# Patient Record
Sex: Male | Born: 1950 | Race: White | Hispanic: No | Smoking: Never smoker
Health system: Southern US, Community
[De-identification: ages and names within clinical notes are randomized; demographics above are authoritative.]

## PROBLEM LIST (undated history)

## (undated) DIAGNOSIS — E785 Hyperlipidemia, unspecified: Secondary | ICD-10-CM

## (undated) DIAGNOSIS — I10 Essential (primary) hypertension: Secondary | ICD-10-CM

## (undated) HISTORY — PX: INGUINAL HERNIA REPAIR: SUR1180

## (undated) HISTORY — DX: Hyperlipidemia, unspecified: E78.5

## (undated) HISTORY — PX: ROTATOR CUFF REPAIR: SHX139

## (undated) HISTORY — PX: UMBILICAL HERNIA REPAIR: SHX196

## (undated) HISTORY — PX: KNEE SURGERY: SHX244

---

## 2004-11-18 ENCOUNTER — Ambulatory Visit (HOSPITAL_COMMUNITY): Admission: RE | Admit: 2004-11-18 | Discharge: 2004-11-18 | Payer: Self-pay | Admitting: Family Medicine

## 2004-11-20 ENCOUNTER — Inpatient Hospital Stay (HOSPITAL_COMMUNITY): Admission: AD | Admit: 2004-11-20 | Discharge: 2004-11-23 | Payer: Self-pay | Admitting: General Surgery

## 2005-01-23 ENCOUNTER — Ambulatory Visit (HOSPITAL_COMMUNITY): Admission: RE | Admit: 2005-01-23 | Discharge: 2005-01-23 | Payer: Self-pay | Admitting: General Surgery

## 2006-07-01 ENCOUNTER — Ambulatory Visit (HOSPITAL_COMMUNITY): Admission: RE | Admit: 2006-07-01 | Discharge: 2006-07-01 | Payer: Self-pay | Admitting: Family Medicine

## 2014-07-26 DIAGNOSIS — Z6827 Body mass index (BMI) 27.0-27.9, adult: Secondary | ICD-10-CM | POA: Diagnosis not present

## 2014-07-26 DIAGNOSIS — J029 Acute pharyngitis, unspecified: Secondary | ICD-10-CM | POA: Diagnosis not present

## 2014-09-20 DIAGNOSIS — E663 Overweight: Secondary | ICD-10-CM | POA: Diagnosis not present

## 2014-09-20 DIAGNOSIS — Z6827 Body mass index (BMI) 27.0-27.9, adult: Secondary | ICD-10-CM | POA: Diagnosis not present

## 2014-09-20 DIAGNOSIS — G894 Chronic pain syndrome: Secondary | ICD-10-CM | POA: Diagnosis not present

## 2014-10-24 DIAGNOSIS — E663 Overweight: Secondary | ICD-10-CM | POA: Diagnosis not present

## 2014-10-24 DIAGNOSIS — Z23 Encounter for immunization: Secondary | ICD-10-CM | POA: Diagnosis not present

## 2014-10-24 DIAGNOSIS — Z6827 Body mass index (BMI) 27.0-27.9, adult: Secondary | ICD-10-CM | POA: Diagnosis not present

## 2014-10-24 DIAGNOSIS — Z0000 Encounter for general adult medical examination without abnormal findings: Secondary | ICD-10-CM | POA: Diagnosis not present

## 2014-11-21 DIAGNOSIS — R5383 Other fatigue: Secondary | ICD-10-CM | POA: Diagnosis not present

## 2014-11-21 DIAGNOSIS — G894 Chronic pain syndrome: Secondary | ICD-10-CM | POA: Diagnosis not present

## 2014-11-21 DIAGNOSIS — E663 Overweight: Secondary | ICD-10-CM | POA: Diagnosis not present

## 2014-11-21 DIAGNOSIS — R7309 Other abnormal glucose: Secondary | ICD-10-CM | POA: Diagnosis not present

## 2014-11-21 DIAGNOSIS — Z6827 Body mass index (BMI) 27.0-27.9, adult: Secondary | ICD-10-CM | POA: Diagnosis not present

## 2014-12-20 DIAGNOSIS — Z6827 Body mass index (BMI) 27.0-27.9, adult: Secondary | ICD-10-CM | POA: Diagnosis not present

## 2014-12-20 DIAGNOSIS — E663 Overweight: Secondary | ICD-10-CM | POA: Diagnosis not present

## 2014-12-20 DIAGNOSIS — G894 Chronic pain syndrome: Secondary | ICD-10-CM | POA: Diagnosis not present

## 2015-01-16 DIAGNOSIS — E663 Overweight: Secondary | ICD-10-CM | POA: Diagnosis not present

## 2015-01-16 DIAGNOSIS — G4700 Insomnia, unspecified: Secondary | ICD-10-CM | POA: Diagnosis not present

## 2015-01-16 DIAGNOSIS — Z6826 Body mass index (BMI) 26.0-26.9, adult: Secondary | ICD-10-CM | POA: Diagnosis not present

## 2015-01-16 DIAGNOSIS — G894 Chronic pain syndrome: Secondary | ICD-10-CM | POA: Diagnosis not present

## 2015-02-05 DIAGNOSIS — S43421A Sprain of right rotator cuff capsule, initial encounter: Secondary | ICD-10-CM | POA: Diagnosis not present

## 2015-02-05 DIAGNOSIS — E663 Overweight: Secondary | ICD-10-CM | POA: Diagnosis not present

## 2015-02-05 DIAGNOSIS — Z6826 Body mass index (BMI) 26.0-26.9, adult: Secondary | ICD-10-CM | POA: Diagnosis not present

## 2015-03-13 DIAGNOSIS — E119 Type 2 diabetes mellitus without complications: Secondary | ICD-10-CM | POA: Diagnosis not present

## 2015-03-13 DIAGNOSIS — R7309 Other abnormal glucose: Secondary | ICD-10-CM | POA: Diagnosis not present

## 2015-03-13 DIAGNOSIS — Z6826 Body mass index (BMI) 26.0-26.9, adult: Secondary | ICD-10-CM | POA: Diagnosis not present

## 2015-03-13 DIAGNOSIS — Z1389 Encounter for screening for other disorder: Secondary | ICD-10-CM | POA: Diagnosis not present

## 2015-03-13 DIAGNOSIS — G894 Chronic pain syndrome: Secondary | ICD-10-CM | POA: Diagnosis not present

## 2015-03-13 DIAGNOSIS — S43421A Sprain of right rotator cuff capsule, initial encounter: Secondary | ICD-10-CM | POA: Diagnosis not present

## 2015-04-19 DIAGNOSIS — Z6825 Body mass index (BMI) 25.0-25.9, adult: Secondary | ICD-10-CM | POA: Diagnosis not present

## 2015-04-19 DIAGNOSIS — Z1389 Encounter for screening for other disorder: Secondary | ICD-10-CM | POA: Diagnosis not present

## 2015-04-19 DIAGNOSIS — R944 Abnormal results of kidney function studies: Secondary | ICD-10-CM | POA: Diagnosis not present

## 2015-04-19 DIAGNOSIS — E663 Overweight: Secondary | ICD-10-CM | POA: Diagnosis not present

## 2015-04-19 DIAGNOSIS — E876 Hypokalemia: Secondary | ICD-10-CM | POA: Diagnosis not present

## 2015-06-26 DIAGNOSIS — G894 Chronic pain syndrome: Secondary | ICD-10-CM | POA: Diagnosis not present

## 2015-06-26 DIAGNOSIS — Z23 Encounter for immunization: Secondary | ICD-10-CM | POA: Diagnosis not present

## 2015-06-26 DIAGNOSIS — Z6824 Body mass index (BMI) 24.0-24.9, adult: Secondary | ICD-10-CM | POA: Diagnosis not present

## 2015-06-26 DIAGNOSIS — G4701 Insomnia due to medical condition: Secondary | ICD-10-CM | POA: Diagnosis not present

## 2015-06-26 DIAGNOSIS — Z1389 Encounter for screening for other disorder: Secondary | ICD-10-CM | POA: Diagnosis not present

## 2015-09-19 DIAGNOSIS — F419 Anxiety disorder, unspecified: Secondary | ICD-10-CM | POA: Diagnosis not present

## 2015-09-19 DIAGNOSIS — G894 Chronic pain syndrome: Secondary | ICD-10-CM | POA: Diagnosis not present

## 2015-09-19 DIAGNOSIS — Z1389 Encounter for screening for other disorder: Secondary | ICD-10-CM | POA: Diagnosis not present

## 2015-09-19 DIAGNOSIS — I10 Essential (primary) hypertension: Secondary | ICD-10-CM | POA: Diagnosis not present

## 2015-09-19 DIAGNOSIS — Z6827 Body mass index (BMI) 27.0-27.9, adult: Secondary | ICD-10-CM | POA: Diagnosis not present

## 2015-09-19 DIAGNOSIS — E663 Overweight: Secondary | ICD-10-CM | POA: Diagnosis not present

## 2015-11-28 DIAGNOSIS — Z1389 Encounter for screening for other disorder: Secondary | ICD-10-CM | POA: Diagnosis not present

## 2015-11-28 DIAGNOSIS — Z0000 Encounter for general adult medical examination without abnormal findings: Secondary | ICD-10-CM | POA: Diagnosis not present

## 2015-11-28 DIAGNOSIS — I10 Essential (primary) hypertension: Secondary | ICD-10-CM | POA: Diagnosis not present

## 2015-11-28 DIAGNOSIS — E782 Mixed hyperlipidemia: Secondary | ICD-10-CM | POA: Diagnosis not present

## 2015-11-28 DIAGNOSIS — E663 Overweight: Secondary | ICD-10-CM | POA: Diagnosis not present

## 2015-11-28 DIAGNOSIS — Z6827 Body mass index (BMI) 27.0-27.9, adult: Secondary | ICD-10-CM | POA: Diagnosis not present

## 2015-11-28 DIAGNOSIS — E119 Type 2 diabetes mellitus without complications: Secondary | ICD-10-CM | POA: Diagnosis not present

## 2016-02-18 DIAGNOSIS — Z6827 Body mass index (BMI) 27.0-27.9, adult: Secondary | ICD-10-CM | POA: Diagnosis not present

## 2016-02-18 DIAGNOSIS — G4700 Insomnia, unspecified: Secondary | ICD-10-CM | POA: Diagnosis not present

## 2016-02-18 DIAGNOSIS — Z1389 Encounter for screening for other disorder: Secondary | ICD-10-CM | POA: Diagnosis not present

## 2016-02-18 DIAGNOSIS — G894 Chronic pain syndrome: Secondary | ICD-10-CM | POA: Diagnosis not present

## 2016-03-26 DIAGNOSIS — Z6826 Body mass index (BMI) 26.0-26.9, adult: Secondary | ICD-10-CM | POA: Diagnosis not present

## 2016-03-26 DIAGNOSIS — Z0000 Encounter for general adult medical examination without abnormal findings: Secondary | ICD-10-CM | POA: Diagnosis not present

## 2016-03-26 DIAGNOSIS — G894 Chronic pain syndrome: Secondary | ICD-10-CM | POA: Diagnosis not present

## 2016-04-24 DIAGNOSIS — Z6827 Body mass index (BMI) 27.0-27.9, adult: Secondary | ICD-10-CM | POA: Diagnosis not present

## 2016-04-24 DIAGNOSIS — G894 Chronic pain syndrome: Secondary | ICD-10-CM | POA: Diagnosis not present

## 2016-04-24 DIAGNOSIS — E663 Overweight: Secondary | ICD-10-CM | POA: Diagnosis not present

## 2016-05-30 DIAGNOSIS — E782 Mixed hyperlipidemia: Secondary | ICD-10-CM | POA: Diagnosis not present

## 2016-05-30 DIAGNOSIS — G894 Chronic pain syndrome: Secondary | ICD-10-CM | POA: Diagnosis not present

## 2016-05-30 DIAGNOSIS — Z6827 Body mass index (BMI) 27.0-27.9, adult: Secondary | ICD-10-CM | POA: Diagnosis not present

## 2016-05-30 DIAGNOSIS — N400 Enlarged prostate without lower urinary tract symptoms: Secondary | ICD-10-CM | POA: Diagnosis not present

## 2016-05-30 DIAGNOSIS — Z23 Encounter for immunization: Secondary | ICD-10-CM | POA: Diagnosis not present

## 2016-05-30 DIAGNOSIS — Z1389 Encounter for screening for other disorder: Secondary | ICD-10-CM | POA: Diagnosis not present

## 2016-05-30 DIAGNOSIS — R7309 Other abnormal glucose: Secondary | ICD-10-CM | POA: Diagnosis not present

## 2016-06-25 DIAGNOSIS — E663 Overweight: Secondary | ICD-10-CM | POA: Diagnosis not present

## 2016-06-25 DIAGNOSIS — Z6826 Body mass index (BMI) 26.0-26.9, adult: Secondary | ICD-10-CM | POA: Diagnosis not present

## 2016-06-25 DIAGNOSIS — G894 Chronic pain syndrome: Secondary | ICD-10-CM | POA: Diagnosis not present

## 2016-07-18 DIAGNOSIS — G894 Chronic pain syndrome: Secondary | ICD-10-CM | POA: Diagnosis not present

## 2016-07-18 DIAGNOSIS — Z6827 Body mass index (BMI) 27.0-27.9, adult: Secondary | ICD-10-CM | POA: Diagnosis not present

## 2016-08-19 DIAGNOSIS — G894 Chronic pain syndrome: Secondary | ICD-10-CM | POA: Diagnosis not present

## 2016-08-19 DIAGNOSIS — Z6828 Body mass index (BMI) 28.0-28.9, adult: Secondary | ICD-10-CM | POA: Diagnosis not present

## 2016-08-19 DIAGNOSIS — E663 Overweight: Secondary | ICD-10-CM | POA: Diagnosis not present

## 2016-08-19 DIAGNOSIS — Z1389 Encounter for screening for other disorder: Secondary | ICD-10-CM | POA: Diagnosis not present

## 2016-10-06 DIAGNOSIS — E663 Overweight: Secondary | ICD-10-CM | POA: Diagnosis not present

## 2016-10-06 DIAGNOSIS — Z1389 Encounter for screening for other disorder: Secondary | ICD-10-CM | POA: Diagnosis not present

## 2016-10-06 DIAGNOSIS — Z6826 Body mass index (BMI) 26.0-26.9, adult: Secondary | ICD-10-CM | POA: Diagnosis not present

## 2016-10-06 DIAGNOSIS — G894 Chronic pain syndrome: Secondary | ICD-10-CM | POA: Diagnosis not present

## 2016-10-06 DIAGNOSIS — F419 Anxiety disorder, unspecified: Secondary | ICD-10-CM | POA: Diagnosis not present

## 2016-11-19 DIAGNOSIS — G894 Chronic pain syndrome: Secondary | ICD-10-CM | POA: Diagnosis not present

## 2016-11-19 DIAGNOSIS — F419 Anxiety disorder, unspecified: Secondary | ICD-10-CM | POA: Diagnosis not present

## 2016-11-19 DIAGNOSIS — Z6827 Body mass index (BMI) 27.0-27.9, adult: Secondary | ICD-10-CM | POA: Diagnosis not present

## 2016-12-11 DIAGNOSIS — Z6827 Body mass index (BMI) 27.0-27.9, adult: Secondary | ICD-10-CM | POA: Diagnosis not present

## 2016-12-11 DIAGNOSIS — G894 Chronic pain syndrome: Secondary | ICD-10-CM | POA: Diagnosis not present

## 2016-12-11 DIAGNOSIS — F419 Anxiety disorder, unspecified: Secondary | ICD-10-CM | POA: Diagnosis not present

## 2016-12-30 DIAGNOSIS — E782 Mixed hyperlipidemia: Secondary | ICD-10-CM | POA: Diagnosis not present

## 2016-12-30 DIAGNOSIS — R7309 Other abnormal glucose: Secondary | ICD-10-CM | POA: Diagnosis not present

## 2017-01-13 DIAGNOSIS — Z6827 Body mass index (BMI) 27.0-27.9, adult: Secondary | ICD-10-CM | POA: Diagnosis not present

## 2017-01-13 DIAGNOSIS — G894 Chronic pain syndrome: Secondary | ICD-10-CM | POA: Diagnosis not present

## 2017-02-10 DIAGNOSIS — E663 Overweight: Secondary | ICD-10-CM | POA: Diagnosis not present

## 2017-02-10 DIAGNOSIS — F419 Anxiety disorder, unspecified: Secondary | ICD-10-CM | POA: Diagnosis not present

## 2017-02-10 DIAGNOSIS — Z6826 Body mass index (BMI) 26.0-26.9, adult: Secondary | ICD-10-CM | POA: Diagnosis not present

## 2017-02-10 DIAGNOSIS — G894 Chronic pain syndrome: Secondary | ICD-10-CM | POA: Diagnosis not present

## 2017-03-01 DIAGNOSIS — Z1211 Encounter for screening for malignant neoplasm of colon: Secondary | ICD-10-CM | POA: Diagnosis not present

## 2017-03-05 DIAGNOSIS — Z1389 Encounter for screening for other disorder: Secondary | ICD-10-CM | POA: Diagnosis not present

## 2017-03-05 DIAGNOSIS — Z6826 Body mass index (BMI) 26.0-26.9, adult: Secondary | ICD-10-CM | POA: Diagnosis not present

## 2017-03-05 DIAGNOSIS — G894 Chronic pain syndrome: Secondary | ICD-10-CM | POA: Diagnosis not present

## 2017-04-06 ENCOUNTER — Encounter (HOSPITAL_COMMUNITY): Payer: Self-pay

## 2017-04-06 ENCOUNTER — Emergency Department (HOSPITAL_COMMUNITY)
Admission: EM | Admit: 2017-04-06 | Discharge: 2017-04-07 | Disposition: A | Discharge status: Home / Self Care | Payer: Medicare Other | Attending: Emergency Medicine | Admitting: Emergency Medicine

## 2017-04-06 DIAGNOSIS — S39840A Fracture of corpus cavernosum penis, initial encounter: Secondary | ICD-10-CM | POA: Insufficient documentation

## 2017-04-06 DIAGNOSIS — S3093XA Unspecified superficial injury of penis, initial encounter: Secondary | ICD-10-CM | POA: Diagnosis not present

## 2017-04-06 DIAGNOSIS — I10 Essential (primary) hypertension: Secondary | ICD-10-CM | POA: Diagnosis not present

## 2017-04-06 DIAGNOSIS — Y999 Unspecified external cause status: Secondary | ICD-10-CM | POA: Diagnosis not present

## 2017-04-06 DIAGNOSIS — Y9389 Activity, other specified: Secondary | ICD-10-CM | POA: Diagnosis not present

## 2017-04-06 DIAGNOSIS — W51XXXA Accidental striking against or bumped into by another person, initial encounter: Secondary | ICD-10-CM | POA: Diagnosis not present

## 2017-04-06 DIAGNOSIS — Y929 Unspecified place or not applicable: Secondary | ICD-10-CM | POA: Diagnosis not present

## 2017-04-06 DIAGNOSIS — S3994XA Unspecified injury of external genitals, initial encounter: Secondary | ICD-10-CM | POA: Diagnosis present

## 2017-04-06 HISTORY — DX: Essential (primary) hypertension: I10

## 2017-04-06 LAB — URINALYSIS, ROUTINE W REFLEX MICROSCOPIC
BILIRUBIN URINE: NEGATIVE
GLUCOSE, UA: NEGATIVE mg/dL
HGB URINE DIPSTICK: NEGATIVE
Ketones, ur: NEGATIVE mg/dL
Leukocytes, UA: NEGATIVE
Nitrite: NEGATIVE
PROTEIN: NEGATIVE mg/dL
Specific Gravity, Urine: 1.012 (ref 1.005–1.030)
pH: 5 (ref 5.0–8.0)

## 2017-04-06 NOTE — ED Triage Notes (Signed)
Patient reports of an erection last night and when he laid on his partner he felt a pop. Patient reports of penis "black and blue". Denies pain or urinary symptoms.

## 2017-04-07 DIAGNOSIS — R350 Frequency of micturition: Secondary | ICD-10-CM | POA: Diagnosis not present

## 2017-04-07 DIAGNOSIS — R351 Nocturia: Secondary | ICD-10-CM | POA: Diagnosis not present

## 2017-04-07 NOTE — ED Provider Notes (Signed)
[facility] DEPT Provider Note   CSN: [id number] Arrival date & time: 04/06/17  1900     History   Chief Complaint Chief Complaint  Patient presents with  . Groin Swelling    HPI Alexander Petty is a 66 y.o. male.  HPI  66 year old male with a history of hypertension presents with concern for an injured penis. He states on the night of 7/22 he was a rectal and about to have intercourse with his girlfriend. He hit her body and felt a pop and some mild pain. Since then has had ecchymosis throughout his penis and scrotum. There is no further pain he has noticed the swelling has gone down a little bit. He has had no trouble urinating. No hematuria.  Past Medical History:  Diagnosis Date  . Hypertension     There are no active problems to display for this patient.   Past Surgical History:  Procedure Laterality Date  . ROTATOR CUFF REPAIR         Home Medications    Prior to Admission medications   Not on File    Family History No family history on file.  Social History Social History  Substance Use Topics  . Smoking status: Never Smoker  . Smokeless tobacco: Never Used  . Alcohol use No     Allergies   Patient has no known allergies.   Review of Systems Review of Systems  Genitourinary: Positive for penile swelling and scrotal swelling. Negative for difficulty urinating, penile pain and testicular pain.  All other systems reviewed and are negative.    Physical Exam Updated Vital Signs BP (!) 204/89 (BP Location: Left Arm)   Pulse 67   Temp 97.7 F (36.5 C) (Oral)   Resp 18   Ht 5\' 8"  (1.727 m)   Wt 77.1 kg (170 lb)   SpO2 99%   BMI 25.85 kg/m   Physical Exam  Constitutional: He is oriented to person, place, and time. He appears well-developed and well-nourished.  HENT:  Head: Normocephalic and atraumatic.  Right Ear: External ear normal.  Left Ear: External ear normal.  Nose: Nose normal.  Eyes: Right eye exhibits no discharge. Left  eye exhibits no discharge.  Pulmonary/Chest: Effort normal.  Abdominal: Soft. He exhibits no distension. There is no tenderness.  Genitourinary:     Genitourinary Comments: No testicular tenderness. Scrotum is ecchymotic but not swollen Dorsal penis has some swelling without tenderness. No tenderness to the shaft or glans penis  Musculoskeletal: He exhibits no edema.  Neurological: He is alert and oriented to person, place, and time.  Skin: Skin is warm and dry.  Nursing note and vitals reviewed.    ED Treatments / Results  Labs (all labs ordered are listed, but only abnormal results are displayed) Labs Reviewed  URINALYSIS, ROUTINE W REFLEX MICROSCOPIC    EKG  EKG Interpretation None       Radiology No results found.  Procedures Procedures (including critical care time)  Medications Ordered in ED Medications - No data to display   Initial Impression / Assessment and Plan / ED Course  I have reviewed the triage vital signs and the nursing notes.  Pertinent labs & imaging results that were available during my care of the patient were reviewed by me and considered in my medical decision making (see chart for details).     Patient has impressive ecchymosis but minimal to no tenderness. Most of the swelling is on ventral aspect. Without significant tenderness. No U/S  at this facility. Injury is over 24 hours old. Given no significant pain, I discussed with Dr. Jeffie Pollock of urology, and we will d/c patient home and he will call office in AM for an appointment this morning to be seen. Patient understands and agrees with plan. Discussed return precautions.  No trouble urinating.  Final Clinical Impressions(s) / ED Diagnoses   Final diagnoses:  Injury to penis, initial encounter    New Prescriptions There are no discharge medications for this patient.    Sherwood Gambler, MD 04/07/17 [phone]

## 2017-04-07 NOTE — ED Notes (Signed)
Pt alert & oriented x4, stable gait. Patient given discharge instructions, paperwork & prescription(s). Patient  instructed to stop at the registration desk to finish any additional paperwork. Patient verbalized understanding. Pt left department w/ no further questions. 

## 2017-04-09 DIAGNOSIS — Z6826 Body mass index (BMI) 26.0-26.9, adult: Secondary | ICD-10-CM | POA: Diagnosis not present

## 2017-04-09 DIAGNOSIS — G894 Chronic pain syndrome: Secondary | ICD-10-CM | POA: Diagnosis not present

## 2017-04-09 DIAGNOSIS — E663 Overweight: Secondary | ICD-10-CM | POA: Diagnosis not present

## 2017-05-05 DIAGNOSIS — R350 Frequency of micturition: Secondary | ICD-10-CM | POA: Diagnosis not present

## 2017-05-05 DIAGNOSIS — E663 Overweight: Secondary | ICD-10-CM | POA: Diagnosis not present

## 2017-05-05 DIAGNOSIS — G894 Chronic pain syndrome: Secondary | ICD-10-CM | POA: Diagnosis not present

## 2017-05-05 DIAGNOSIS — Z6826 Body mass index (BMI) 26.0-26.9, adult: Secondary | ICD-10-CM | POA: Diagnosis not present

## 2017-05-28 DIAGNOSIS — M1991 Primary osteoarthritis, unspecified site: Secondary | ICD-10-CM | POA: Diagnosis not present

## 2017-05-28 DIAGNOSIS — M2550 Pain in unspecified joint: Secondary | ICD-10-CM | POA: Diagnosis not present

## 2017-05-28 DIAGNOSIS — Z23 Encounter for immunization: Secondary | ICD-10-CM | POA: Diagnosis not present

## 2017-05-28 DIAGNOSIS — G894 Chronic pain syndrome: Secondary | ICD-10-CM | POA: Diagnosis not present

## 2017-05-28 DIAGNOSIS — I10 Essential (primary) hypertension: Secondary | ICD-10-CM | POA: Diagnosis not present

## 2017-05-28 DIAGNOSIS — Z6826 Body mass index (BMI) 26.0-26.9, adult: Secondary | ICD-10-CM | POA: Diagnosis not present

## 2017-07-09 DIAGNOSIS — Z1389 Encounter for screening for other disorder: Secondary | ICD-10-CM | POA: Diagnosis not present

## 2017-07-09 DIAGNOSIS — R7309 Other abnormal glucose: Secondary | ICD-10-CM | POA: Diagnosis not present

## 2017-07-09 DIAGNOSIS — E663 Overweight: Secondary | ICD-10-CM | POA: Diagnosis not present

## 2017-07-09 DIAGNOSIS — F419 Anxiety disorder, unspecified: Secondary | ICD-10-CM | POA: Diagnosis not present

## 2017-07-09 DIAGNOSIS — N400 Enlarged prostate without lower urinary tract symptoms: Secondary | ICD-10-CM | POA: Diagnosis not present

## 2017-07-09 DIAGNOSIS — Z6826 Body mass index (BMI) 26.0-26.9, adult: Secondary | ICD-10-CM | POA: Diagnosis not present

## 2017-07-09 DIAGNOSIS — G8929 Other chronic pain: Secondary | ICD-10-CM | POA: Diagnosis not present

## 2017-07-09 DIAGNOSIS — I10 Essential (primary) hypertension: Secondary | ICD-10-CM | POA: Diagnosis not present

## 2017-07-09 DIAGNOSIS — E782 Mixed hyperlipidemia: Secondary | ICD-10-CM | POA: Diagnosis not present

## 2017-08-20 DIAGNOSIS — G894 Chronic pain syndrome: Secondary | ICD-10-CM | POA: Diagnosis not present

## 2017-08-20 DIAGNOSIS — Z681 Body mass index (BMI) 19 or less, adult: Secondary | ICD-10-CM | POA: Diagnosis not present

## 2017-09-29 DIAGNOSIS — Z6827 Body mass index (BMI) 27.0-27.9, adult: Secondary | ICD-10-CM | POA: Diagnosis not present

## 2017-09-29 DIAGNOSIS — E663 Overweight: Secondary | ICD-10-CM | POA: Diagnosis not present

## 2017-09-29 DIAGNOSIS — R69 Illness, unspecified: Secondary | ICD-10-CM | POA: Diagnosis not present

## 2017-09-29 DIAGNOSIS — G894 Chronic pain syndrome: Secondary | ICD-10-CM | POA: Diagnosis not present

## 2017-11-10 DIAGNOSIS — Z6827 Body mass index (BMI) 27.0-27.9, adult: Secondary | ICD-10-CM | POA: Diagnosis not present

## 2017-11-10 DIAGNOSIS — G894 Chronic pain syndrome: Secondary | ICD-10-CM | POA: Diagnosis not present

## 2017-11-10 DIAGNOSIS — E663 Overweight: Secondary | ICD-10-CM | POA: Diagnosis not present

## 2017-12-25 DIAGNOSIS — G894 Chronic pain syndrome: Secondary | ICD-10-CM | POA: Diagnosis not present

## 2017-12-25 DIAGNOSIS — E663 Overweight: Secondary | ICD-10-CM | POA: Diagnosis not present

## 2017-12-25 DIAGNOSIS — Z6826 Body mass index (BMI) 26.0-26.9, adult: Secondary | ICD-10-CM | POA: Diagnosis not present

## 2018-03-02 DIAGNOSIS — R69 Illness, unspecified: Secondary | ICD-10-CM | POA: Diagnosis not present

## 2018-03-11 DIAGNOSIS — Z6825 Body mass index (BMI) 25.0-25.9, adult: Secondary | ICD-10-CM | POA: Diagnosis not present

## 2018-03-11 DIAGNOSIS — E663 Overweight: Secondary | ICD-10-CM | POA: Diagnosis not present

## 2018-03-11 DIAGNOSIS — G894 Chronic pain syndrome: Secondary | ICD-10-CM | POA: Diagnosis not present

## 2018-04-13 DIAGNOSIS — H2513 Age-related nuclear cataract, bilateral: Secondary | ICD-10-CM | POA: Diagnosis not present

## 2018-04-13 DIAGNOSIS — D3141 Benign neoplasm of right ciliary body: Secondary | ICD-10-CM | POA: Diagnosis not present

## 2018-05-11 DIAGNOSIS — N400 Enlarged prostate without lower urinary tract symptoms: Secondary | ICD-10-CM | POA: Diagnosis not present

## 2018-05-11 DIAGNOSIS — G8929 Other chronic pain: Secondary | ICD-10-CM | POA: Diagnosis not present

## 2018-05-11 DIAGNOSIS — Z1389 Encounter for screening for other disorder: Secondary | ICD-10-CM | POA: Diagnosis not present

## 2018-05-11 DIAGNOSIS — Z6824 Body mass index (BMI) 24.0-24.9, adult: Secondary | ICD-10-CM | POA: Diagnosis not present

## 2018-05-11 DIAGNOSIS — R1904 Left lower quadrant abdominal swelling, mass and lump: Secondary | ICD-10-CM | POA: Diagnosis not present

## 2018-05-11 DIAGNOSIS — E782 Mixed hyperlipidemia: Secondary | ICD-10-CM | POA: Diagnosis not present

## 2018-05-11 DIAGNOSIS — R7309 Other abnormal glucose: Secondary | ICD-10-CM | POA: Diagnosis not present

## 2018-05-11 DIAGNOSIS — M159 Polyosteoarthritis, unspecified: Secondary | ICD-10-CM | POA: Diagnosis not present

## 2018-05-11 DIAGNOSIS — Z0001 Encounter for general adult medical examination with abnormal findings: Secondary | ICD-10-CM | POA: Diagnosis not present

## 2018-05-11 DIAGNOSIS — R69 Illness, unspecified: Secondary | ICD-10-CM | POA: Diagnosis not present

## 2018-05-28 ENCOUNTER — Other Ambulatory Visit (HOSPITAL_COMMUNITY): Payer: Self-pay | Admitting: Family Medicine

## 2018-05-28 DIAGNOSIS — R1904 Left lower quadrant abdominal swelling, mass and lump: Secondary | ICD-10-CM

## 2018-06-17 ENCOUNTER — Ambulatory Visit (HOSPITAL_COMMUNITY)
Admission: RE | Admit: 2018-06-17 | Discharge: 2018-06-17 | Disposition: A | Discharge status: Home / Self Care | Payer: Medicare HMO | Source: Ambulatory Visit | Attending: Family Medicine | Admitting: Family Medicine

## 2018-06-17 DIAGNOSIS — K5730 Diverticulosis of large intestine without perforation or abscess without bleeding: Secondary | ICD-10-CM | POA: Insufficient documentation

## 2018-06-17 DIAGNOSIS — R1904 Left lower quadrant abdominal swelling, mass and lump: Secondary | ICD-10-CM

## 2018-06-17 MED ORDER — IOPAMIDOL (ISOVUE-300) INJECTION 61%
100.0000 mL | Freq: Once | INTRAVENOUS | Status: AC | PRN
  Administered 2018-06-17: 100 mL via INTRAVENOUS

## 2018-07-08 DIAGNOSIS — R69 Illness, unspecified: Secondary | ICD-10-CM | POA: Diagnosis not present

## 2018-07-08 DIAGNOSIS — G894 Chronic pain syndrome: Secondary | ICD-10-CM | POA: Diagnosis not present

## 2018-07-08 DIAGNOSIS — Z23 Encounter for immunization: Secondary | ICD-10-CM | POA: Diagnosis not present

## 2018-07-08 DIAGNOSIS — K5732 Diverticulitis of large intestine without perforation or abscess without bleeding: Secondary | ICD-10-CM | POA: Diagnosis not present

## 2018-07-08 DIAGNOSIS — Z6824 Body mass index (BMI) 24.0-24.9, adult: Secondary | ICD-10-CM | POA: Diagnosis not present

## 2018-08-03 ENCOUNTER — Encounter: Payer: Self-pay | Admitting: Internal Medicine

## 2018-08-03 DIAGNOSIS — Z6825 Body mass index (BMI) 25.0-25.9, adult: Secondary | ICD-10-CM | POA: Diagnosis not present

## 2018-08-03 DIAGNOSIS — G894 Chronic pain syndrome: Secondary | ICD-10-CM | POA: Diagnosis not present

## 2018-08-03 DIAGNOSIS — Z1389 Encounter for screening for other disorder: Secondary | ICD-10-CM | POA: Diagnosis not present

## 2018-08-03 DIAGNOSIS — E663 Overweight: Secondary | ICD-10-CM | POA: Diagnosis not present

## 2018-08-31 DIAGNOSIS — E663 Overweight: Secondary | ICD-10-CM | POA: Diagnosis not present

## 2018-08-31 DIAGNOSIS — Z6825 Body mass index (BMI) 25.0-25.9, adult: Secondary | ICD-10-CM | POA: Diagnosis not present

## 2018-08-31 DIAGNOSIS — G894 Chronic pain syndrome: Secondary | ICD-10-CM | POA: Diagnosis not present

## 2018-09-24 DIAGNOSIS — G894 Chronic pain syndrome: Secondary | ICD-10-CM | POA: Diagnosis not present

## 2018-09-24 DIAGNOSIS — Z6825 Body mass index (BMI) 25.0-25.9, adult: Secondary | ICD-10-CM | POA: Diagnosis not present

## 2018-09-24 DIAGNOSIS — E663 Overweight: Secondary | ICD-10-CM | POA: Diagnosis not present

## 2018-10-12 DIAGNOSIS — R69 Illness, unspecified: Secondary | ICD-10-CM | POA: Diagnosis not present

## 2018-10-26 DIAGNOSIS — Z6825 Body mass index (BMI) 25.0-25.9, adult: Secondary | ICD-10-CM | POA: Diagnosis not present

## 2018-10-26 DIAGNOSIS — G894 Chronic pain syndrome: Secondary | ICD-10-CM | POA: Diagnosis not present

## 2018-10-26 DIAGNOSIS — E663 Overweight: Secondary | ICD-10-CM | POA: Diagnosis not present

## 2018-10-28 ENCOUNTER — Other Ambulatory Visit: Payer: Self-pay | Admitting: *Deleted

## 2018-10-28 ENCOUNTER — Encounter: Payer: Self-pay | Admitting: *Deleted

## 2018-10-28 ENCOUNTER — Encounter: Payer: Self-pay | Admitting: Gastroenterology

## 2018-10-28 ENCOUNTER — Telehealth: Payer: Self-pay | Admitting: *Deleted

## 2018-10-28 ENCOUNTER — Ambulatory Visit (INDEPENDENT_AMBULATORY_CARE_PROVIDER_SITE_OTHER): Payer: Medicare HMO | Admitting: Gastroenterology

## 2018-10-28 VITALS — BP 177/94 | HR 84 | Temp 97.5°F | Ht 67.0 in | Wt 167.4 lb

## 2018-10-28 DIAGNOSIS — F1190 Opioid use, unspecified, uncomplicated: Secondary | ICD-10-CM

## 2018-10-28 DIAGNOSIS — Z1211 Encounter for screening for malignant neoplasm of colon: Secondary | ICD-10-CM | POA: Diagnosis not present

## 2018-10-28 DIAGNOSIS — R69 Illness, unspecified: Secondary | ICD-10-CM | POA: Diagnosis not present

## 2018-10-28 MED ORDER — NA SULFATE-K SULFATE-MG SULF 17.5-3.13-1.6 GM/177ML PO SOLN: 1.0000 | ORAL | 0 refills | Status: DC

## 2018-10-28 NOTE — Telephone Encounter (Signed)
Pre-op is scheduled for 12/17/2018 at 9:00am. Letter mailed. Patient aware.

## 2018-10-28 NOTE — Assessment & Plan Note (Signed)
Very pleasant 68 year old gentleman presenting to schedule a colonoscopy at the request of Dr. Hilma Favors.  Currently he is not having any GI symptoms.  About 4 months ago he had 5 to 6-week history of abdominal pain associated with change in bowel habits, small-volume hematochezia, 15 to 20 pound weight loss.  CT scan was unremarkable. Patient states his symptoms went away on their own.  He has been feeling normal for couple months and has gained essentially all of his weight back.  Plan for colonoscopy in the near future.  Given chronic opioid use, plan for deep sedation.  I have discussed the risks, alternatives, benefits with regards to but not limited to the risk of reaction to medication, bleeding, infection, perforation and the patient is agreeable to proceed. Written consent to be obtained.

## 2018-10-28 NOTE — Progress Notes (Signed)
Primary Care Physician:  Sharilyn Sites, MD  Primary Gastroenterologist:  Garfield Cornea, MD   Chief Complaint  Patient presents with  . Colonoscopy    HPI:  Alexander Petty is a 68 y.o. male here at the request of Dr. Hilma Favors for consideration of a colonoscopy. Patient states Dr. Gala Romney performed a colonoscopy on his in the remote past, maybe in the around [place]. No records available at this time. He may have had a polyp. He was in the hospital at that time with diarrhea.   Back in later part of 2019 he had 5-6 week history of 15-20 pound weight loss associated with abd pain, fecal urgency/loose stool, small amount of brbpr. He had CT A/P which was unremarkable. Symptoms resolved on their on. He has gained almost all of his weight back. His stools are now normal. No further rectal bleeding. No UGI symptoms.    No FH of CRC.  States his BP up today because he's nervous. Not on any BP medications. BP 148/68 at last OV with PCP.    Current Outpatient Medications  Medication Sig Dispense Refill  . Ascorbic Acid (VITAMIN C) 1000 MG tablet Take 1,000 mg by mouth daily.    Marland Kitchen aspirin EC 81 MG tablet Take 81 mg by mouth daily.    Marland Kitchen HYDROcodone-acetaminophen (NORCO) 10-325 MG tablet Take 1 tablet by mouth 4 (four) times daily.    Marland Kitchen ibuprofen (ADVIL,MOTRIN) 800 MG tablet Take 800 mg by mouth as needed. Takes 2-3/day    . Omega-3 Fatty Acids (FISH OIL PO) Take by mouth daily.     No current facility-administered medications for this visit.     Allergies as of 10/28/2018  . (No Known Allergies)    Past Medical History:  Diagnosis Date  . Hypertension     Past Surgical History:  Procedure Laterality Date  . INGUINAL HERNIA REPAIR     X3  . KNEE SURGERY Bilateral   . ROTATOR CUFF REPAIR      Family History  Problem Relation Age of Onset  . Colon polyps Neg Hx   . Colon cancer Neg Hx     Social History   Socioeconomic History  . Marital status: Divorced    Spouse name:  Not on file  . Number of children: Not on file  . Years of education: Not on file  . Highest education level: Not on file  Occupational History  . Not on file  Social Needs  . Financial resource strain: Not on file  . Food insecurity:    Worry: Not on file    Inability: Not on file  . Transportation needs:    Medical: Not on file    Non-medical: Not on file  Tobacco Use  . Smoking status: Never Smoker  . Smokeless tobacco: Never Used  Substance and Sexual Activity  . Alcohol use: No  . Drug use: No  . Sexual activity: Not on file  Lifestyle  . Physical activity:    Days per week: Not on file    Minutes per session: Not on file  . Stress: Not on file  Relationships  . Social connections:    Talks on phone: Not on file    Gets together: Not on file    Attends religious service: Not on file    Active member of club or organization: Not on file    Attends meetings of clubs or organizations: Not on file    Relationship status: Not on file  .  Intimate partner violence:    Fear of current or ex partner: Not on file    Emotionally abused: Not on file    Physically abused: Not on file    Forced sexual activity: Not on file  Other Topics Concern  . Not on file  Social History Narrative  . Not on file      ROS:  General: Negative for anorexia, weight loss, fever, chills, fatigue, weakness. Eyes: Negative for vision changes.  ENT: Negative for hoarseness, difficulty swallowing , nasal congestion. CV: Negative for chest pain, angina, palpitations, dyspnea on exertion, peripheral edema.  Respiratory: Negative for dyspnea at rest, dyspnea on exertion, cough, sputum, wheezing.  GI: See history of present illness. GU:  Negative for dysuria, hematuria, urinary incontinence, urinary frequency, nocturnal urination.  MS: Negative for joint pain, low back pain.  Derm: Negative for rash or itching.  Neuro: Negative for weakness, abnormal sensation, seizure, frequent headaches,  memory loss, confusion.  Psych: Negative for anxiety, depression, suicidal ideation, hallucinations.  Endo: Negative for unusual weight change.  Heme: Negative for bruising or bleeding. Allergy: Negative for rash or hives.    Physical Examination:  BP (!) 177/94   Pulse 84   Temp (!) 97.5 F (36.4 C) (Oral)   Ht 5\' 7"  (1.702 m)   Wt 167 lb 6.4 oz (75.9 kg)   BMI 26.22 kg/m    General: Well-nourished, well-developed in no acute distress.  Head: Normocephalic, atraumatic.   Eyes: Conjunctiva pink, no icterus. Mouth: Oropharyngeal mucosa moist and pink , no lesions erythema or exudate. Neck: Supple without thyromegaly, masses, or lymphadenopathy.  Lungs: Clear to auscultation bilaterally.  Heart: Regular rate and rhythm, no murmurs rubs or gallops.  Abdomen: Bowel sounds are normal, nontender, nondistended, no hepatosplenomegaly or masses, no abdominal bruits or    hernia , no rebound or guarding.   Rectal: not performed Extremities: No lower extremity edema. No clubbing or deformities.  Neuro: Alert and oriented x 4 , grossly normal neurologically.  Skin: Warm and dry, no rash or jaundice.   Psych: Alert and cooperative, normal mood and affect.

## 2018-10-28 NOTE — Patient Instructions (Signed)
Colonoscopy as scheduled.  Please see separate instructions.

## 2018-11-01 NOTE — Progress Notes (Signed)
CC'D TO PCP °

## 2018-11-17 DIAGNOSIS — G894 Chronic pain syndrome: Secondary | ICD-10-CM | POA: Diagnosis not present

## 2018-11-17 DIAGNOSIS — Z1389 Encounter for screening for other disorder: Secondary | ICD-10-CM | POA: Diagnosis not present

## 2018-11-17 DIAGNOSIS — Z6825 Body mass index (BMI) 25.0-25.9, adult: Secondary | ICD-10-CM | POA: Diagnosis not present

## 2018-11-17 DIAGNOSIS — J069 Acute upper respiratory infection, unspecified: Secondary | ICD-10-CM | POA: Diagnosis not present

## 2018-11-17 DIAGNOSIS — E663 Overweight: Secondary | ICD-10-CM | POA: Diagnosis not present

## 2018-12-01 DIAGNOSIS — Z6825 Body mass index (BMI) 25.0-25.9, adult: Secondary | ICD-10-CM | POA: Diagnosis not present

## 2018-12-01 DIAGNOSIS — J029 Acute pharyngitis, unspecified: Secondary | ICD-10-CM | POA: Diagnosis not present

## 2018-12-01 DIAGNOSIS — J019 Acute sinusitis, unspecified: Secondary | ICD-10-CM | POA: Diagnosis not present

## 2018-12-01 DIAGNOSIS — E663 Overweight: Secondary | ICD-10-CM | POA: Diagnosis not present

## 2018-12-13 ENCOUNTER — Telehealth: Payer: Self-pay

## 2018-12-13 NOTE — Telephone Encounter (Signed)
Pre-op appt rescheduled to 02/10/19 at 1:45pm. Letter mailed with procedure instructions.

## 2018-12-13 NOTE — Telephone Encounter (Signed)
Called pt to reschedule TCS w/Propofol w/RMR that was for 12/23/18 d/t COVID-19 restrictions. TCS rescheduled to 02/14/19 at 1:00pm. Will mail new instructions after pre-op is rescheduled.

## 2018-12-16 DIAGNOSIS — Z6825 Body mass index (BMI) 25.0-25.9, adult: Secondary | ICD-10-CM | POA: Diagnosis not present

## 2018-12-16 DIAGNOSIS — E663 Overweight: Secondary | ICD-10-CM | POA: Diagnosis not present

## 2018-12-16 DIAGNOSIS — G894 Chronic pain syndrome: Secondary | ICD-10-CM | POA: Diagnosis not present

## 2018-12-16 IMAGING — CT CT ABD-PELV W/ CM
2 of 4 series · 16 of 46 positions shown, 18 images · IV contrast (Isovue)
Comparison: 11/20/2004

CLINICAL DATA: Left lower quadrant swelling

EXAM:
CT ABDOMEN AND PELVIS WITH CONTRAST
TECHNIQUE: Multidetector CT imaging of the abdomen and pelvis was performed
using the standard protocol following bolus administration of
intravenous contrast.
CONTRAST:  100mL GZZ1V2-6GG IOPAMIDOL (GZZ1V2-6GG) INJECTION 61%

[Series 2: axial st · axial · 0.68mm/px · z∈[+1022,+1392]mm · 13 of 82 slices shown, 15 images]
[im 4/82  soft-tissue]
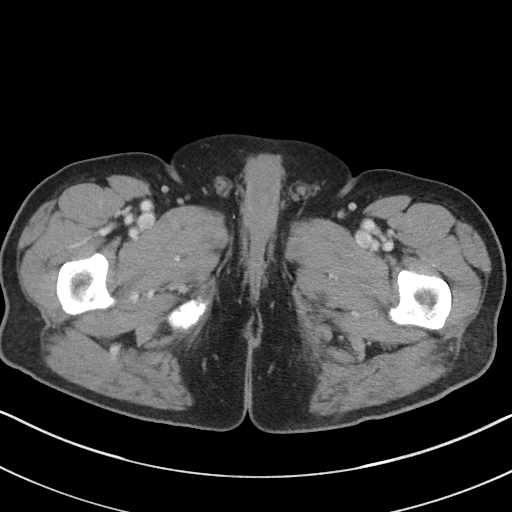
[im 4/82  bone]
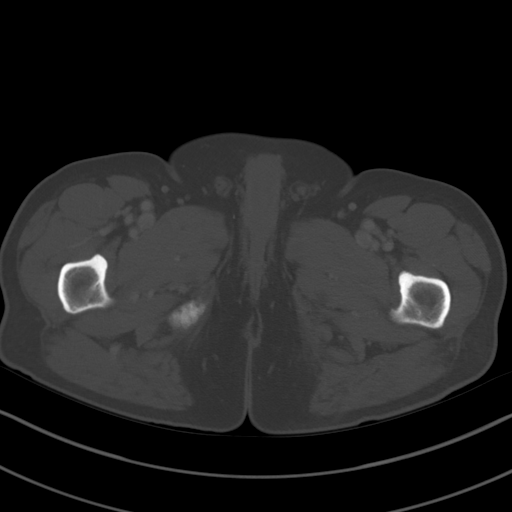
[im 11/82  soft-tissue]
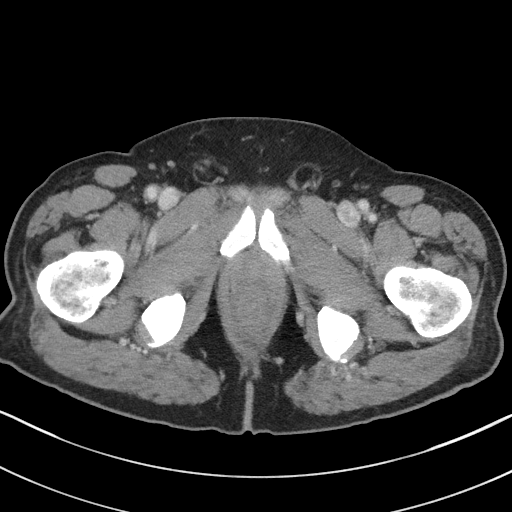
[im 17/82  soft-tissue]
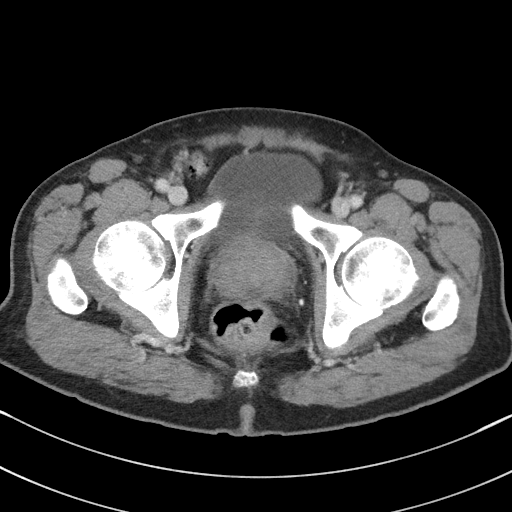
[im 24/82  soft-tissue]
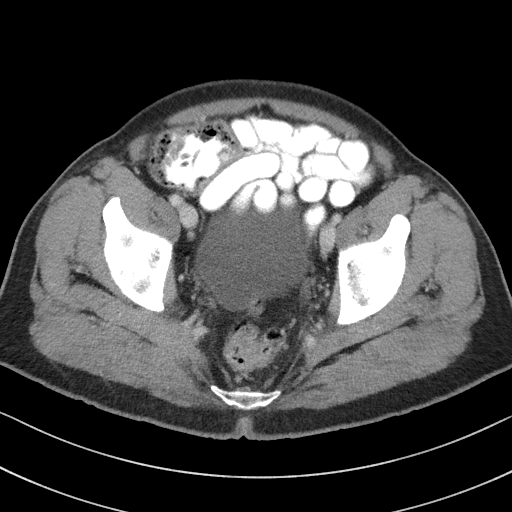
[im 28/82  soft-tissue]
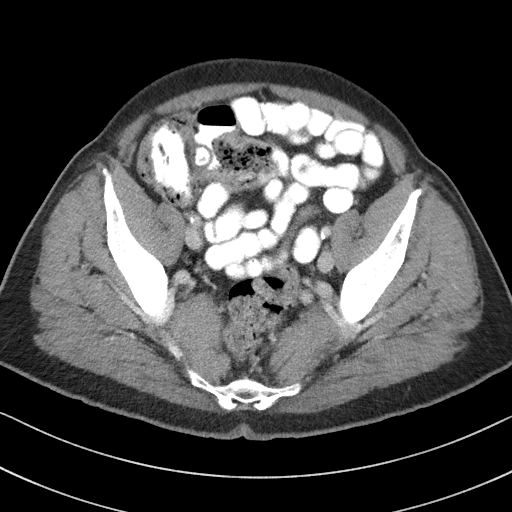
[im 34/82  soft-tissue]
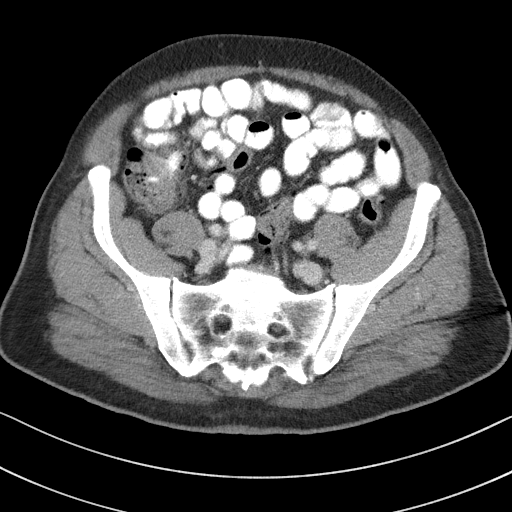
[im 41/82  soft-tissue]
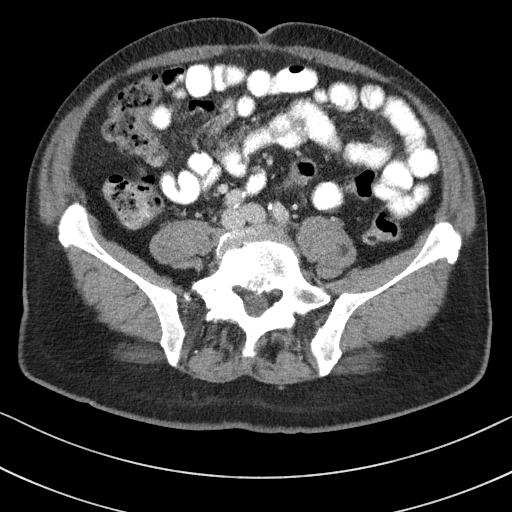
[im 48/82  soft-tissue]
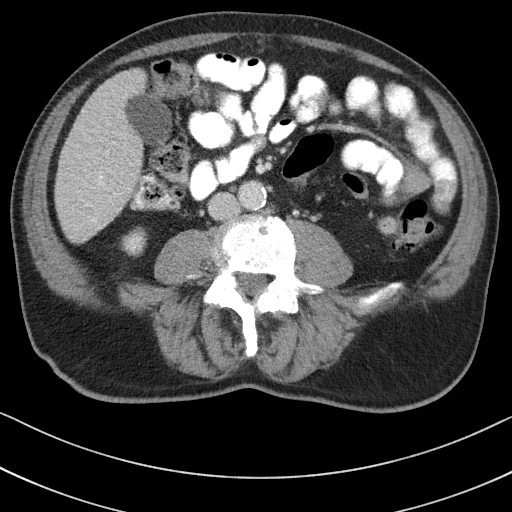
[im 55/82  soft-tissue]
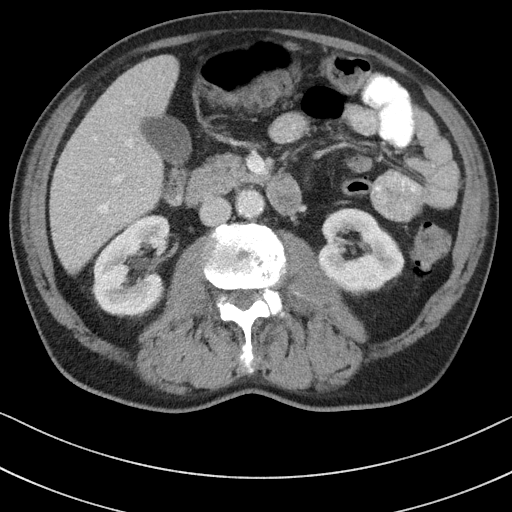
[im 55/82  bone]
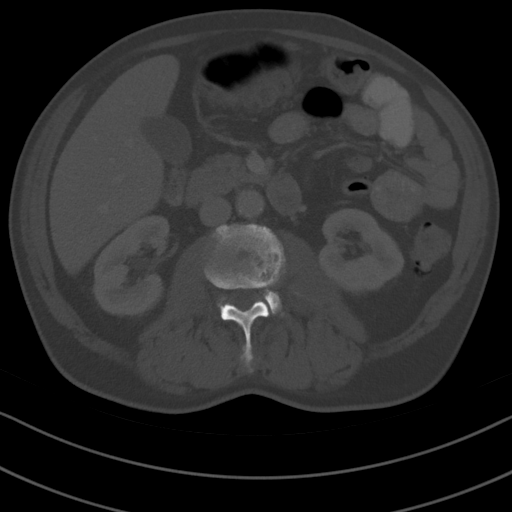
[im 58/82  soft-tissue]
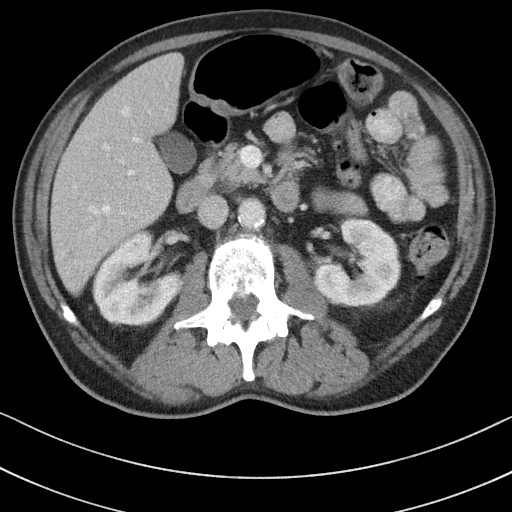
[im 65/82  soft-tissue]
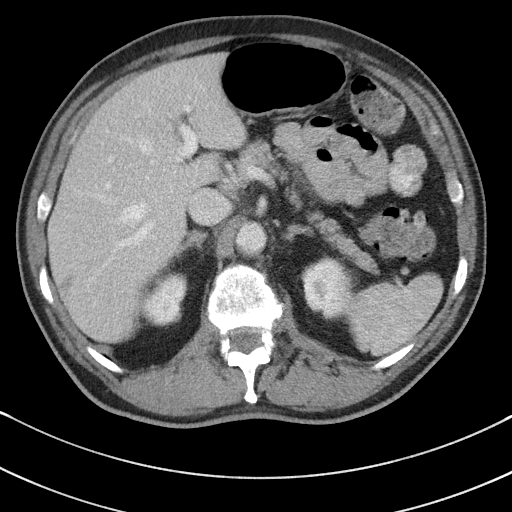
[im 71/82  soft-tissue]
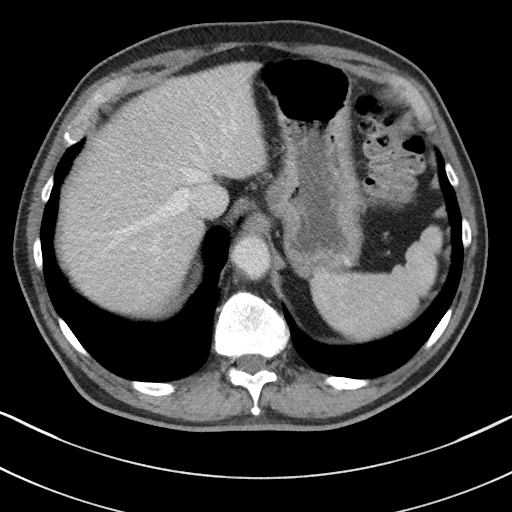
[im 78/82  soft-tissue]
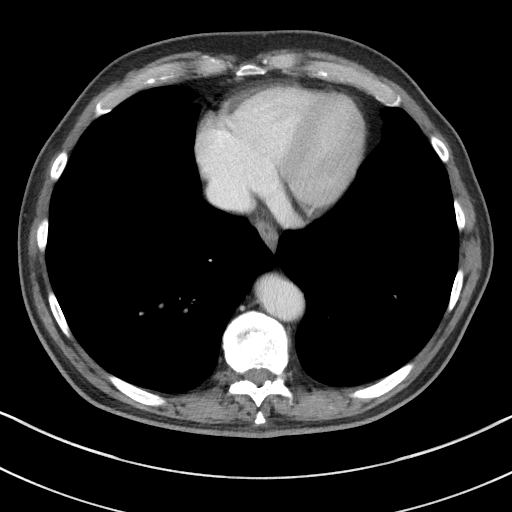

[Series 6: coronal st · coronal · 0.64mm/px · 3 of 117 slices shown]
[im 39/117  soft-tissue]
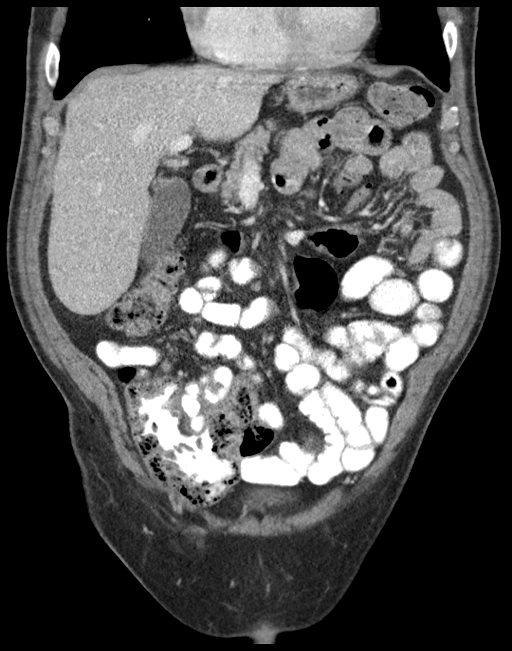
[im 52/117  soft-tissue]
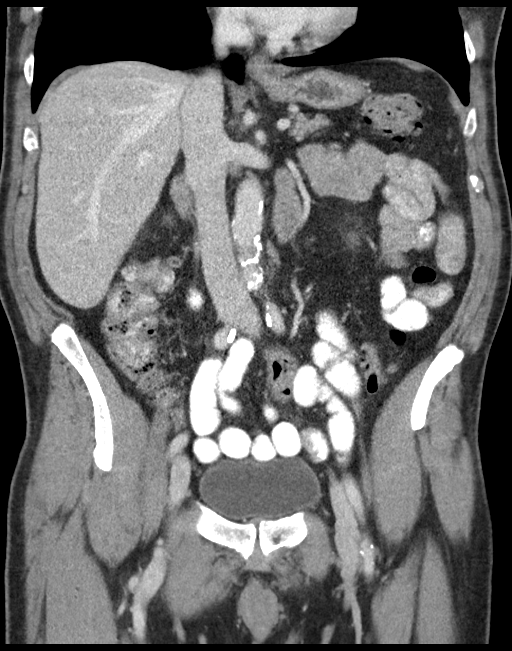
[im 65/117  soft-tissue]
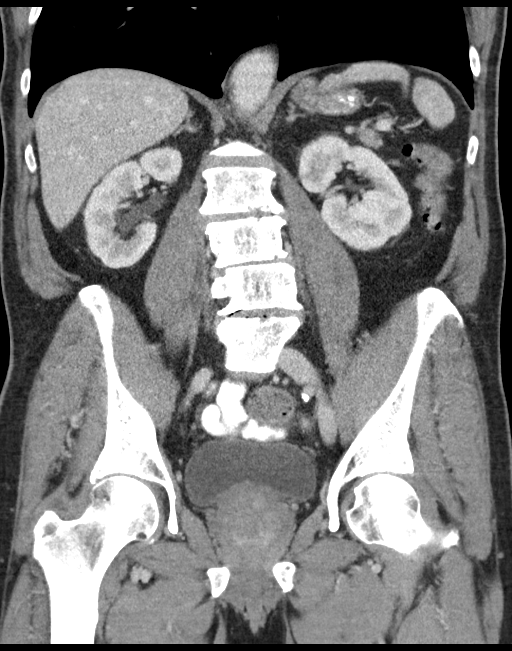

[16 of 46 positions shown; findings below may reference images not displayed]

FINDINGS: Lower chest:  No contributory findings.

Hepatobiliary: 2 known hemangiomas in the right liver that are very
subtle on this study, contrast timing versus interval sclerosis and
contraction.No evidence of biliary obstruction or stone.

Pancreas: Unremarkable.

Spleen: Unremarkable.

Adrenals/Urinary Tract: Negative adrenals. Interval contraction of
the left lower pole cyst, which accounts for increased density.
Unremarkable bladder.

Stomach/Bowel: No obstruction. Elongated left colon with mild
diverticulosis.

Vascular/Lymphatic: No acute vascular abnormality. Atherosclerosis.
No mass or adenopathy.

Reproductive:Symmetric prostate enlargement.

Other: Shallow, fatty right inguinal hernia.

Musculoskeletal: No acute abnormalities. Diffuse disc and facet
degeneration with L4-5 anterolisthesis and L2-3 retrolisthesis.
IMPRESSION: 1. No acute finding or specific explanation for symptoms.
2. Tortuous left colon with diverticulosis.

## 2018-12-17 ENCOUNTER — Inpatient Hospital Stay (HOSPITAL_COMMUNITY): Admission: RE | Admit: 2018-12-17 | Payer: Medicare HMO | Source: Ambulatory Visit

## 2019-01-12 DIAGNOSIS — R69 Illness, unspecified: Secondary | ICD-10-CM | POA: Diagnosis not present

## 2019-01-17 DIAGNOSIS — Z6824 Body mass index (BMI) 24.0-24.9, adult: Secondary | ICD-10-CM | POA: Diagnosis not present

## 2019-01-17 DIAGNOSIS — G894 Chronic pain syndrome: Secondary | ICD-10-CM | POA: Diagnosis not present

## 2019-02-02 ENCOUNTER — Telehealth: Payer: Self-pay

## 2019-02-02 NOTE — Telephone Encounter (Signed)
TCS w/Propofol w/RMR scheduled for 02/14/19 needs to be rescheduled d/t COVID-19 restrictions. Tried to call pt, no answer, LMOVM for return call.

## 2019-02-02 NOTE — Telephone Encounter (Signed)
Pt called office and was informed. We will call later to reschedule procedure.

## 2019-02-10 ENCOUNTER — Other Ambulatory Visit (HOSPITAL_COMMUNITY): Payer: Medicare HMO

## 2019-02-10 DIAGNOSIS — G894 Chronic pain syndrome: Secondary | ICD-10-CM | POA: Diagnosis not present

## 2019-02-10 DIAGNOSIS — R7309 Other abnormal glucose: Secondary | ICD-10-CM | POA: Diagnosis not present

## 2019-02-10 DIAGNOSIS — I10 Essential (primary) hypertension: Secondary | ICD-10-CM | POA: Diagnosis not present

## 2019-02-10 DIAGNOSIS — E7849 Other hyperlipidemia: Secondary | ICD-10-CM | POA: Diagnosis not present

## 2019-02-10 DIAGNOSIS — Z6824 Body mass index (BMI) 24.0-24.9, adult: Secondary | ICD-10-CM | POA: Diagnosis not present

## 2019-02-10 DIAGNOSIS — R69 Illness, unspecified: Secondary | ICD-10-CM | POA: Diagnosis not present

## 2019-02-14 ENCOUNTER — Telehealth: Payer: Self-pay | Admitting: *Deleted

## 2019-02-14 ENCOUNTER — Encounter (HOSPITAL_COMMUNITY): Admission: RE | Payer: Self-pay | Source: Home / Self Care

## 2019-02-14 ENCOUNTER — Ambulatory Visit (HOSPITAL_COMMUNITY): Admission: RE | Admit: 2019-02-14 | Payer: Medicare HMO | Source: Home / Self Care | Admitting: Internal Medicine

## 2019-02-14 SURGERY — COLONOSCOPY WITH PROPOFOL
Anesthesia: Monitor Anesthesia Care

## 2019-02-14 NOTE — Telephone Encounter (Signed)
LMOVM to call back to schedule TCS with propofol with RMR

## 2019-02-15 NOTE — Telephone Encounter (Signed)
LMOVM. Letter mailed to call back to schedule procedure.

## 2019-03-02 DIAGNOSIS — Z6824 Body mass index (BMI) 24.0-24.9, adult: Secondary | ICD-10-CM | POA: Diagnosis not present

## 2019-03-02 DIAGNOSIS — G894 Chronic pain syndrome: Secondary | ICD-10-CM | POA: Diagnosis not present

## 2019-03-03 ENCOUNTER — Other Ambulatory Visit: Payer: Medicare HMO

## 2019-03-03 ENCOUNTER — Other Ambulatory Visit: Payer: Self-pay | Admitting: *Deleted

## 2019-03-03 ENCOUNTER — Encounter: Payer: Self-pay | Admitting: *Deleted

## 2019-03-03 ENCOUNTER — Other Ambulatory Visit: Payer: Self-pay | Admitting: Internal Medicine

## 2019-03-03 DIAGNOSIS — Z20822 Contact with and (suspected) exposure to covid-19: Secondary | ICD-10-CM

## 2019-03-03 DIAGNOSIS — Z1211 Encounter for screening for malignant neoplasm of colon: Secondary | ICD-10-CM

## 2019-03-03 DIAGNOSIS — R6889 Other general symptoms and signs: Secondary | ICD-10-CM | POA: Diagnosis not present

## 2019-03-03 MED ORDER — NA SULFATE-K SULFATE-MG SULF 17.5-3.13-1.6 GM/177ML PO SOLN: 1.0000 | Freq: Once | ORAL | 0 refills | Status: AC

## 2019-03-03 NOTE — Telephone Encounter (Signed)
Patient scheduled for procedure 05/02/2019 at 1pm. Patient will need prep sent to pharmacy again. I advised will mail new instructions with his pre-op appt as well. Confirmed address. Orders entered.

## 2019-03-07 LAB — NOVEL CORONAVIRUS, NAA: SARS-CoV-2, NAA: NOT DETECTED

## 2019-03-31 DIAGNOSIS — Z6824 Body mass index (BMI) 24.0-24.9, adult: Secondary | ICD-10-CM | POA: Diagnosis not present

## 2019-03-31 DIAGNOSIS — G894 Chronic pain syndrome: Secondary | ICD-10-CM | POA: Diagnosis not present

## 2019-04-22 ENCOUNTER — Telehealth: Payer: Self-pay | Admitting: Internal Medicine

## 2019-04-22 NOTE — Telephone Encounter (Signed)
Called patient. He is requesting to schedule in November. Advised we currently do not have that schedule and advised we will call once we do. Called endo and made

## 2019-04-22 NOTE — Telephone Encounter (Signed)
Pt is scheduled with RMR on 8/17 for a colonoscopy w/propofol and wants to push it back to Oct/Nov. He said he wasn't comfortable due to covid and would rather wait. [phone]

## 2019-04-28 ENCOUNTER — Inpatient Hospital Stay (HOSPITAL_COMMUNITY): Admission: RE | Admit: 2019-04-28 | Payer: Medicare HMO | Source: Ambulatory Visit

## 2019-05-02 ENCOUNTER — Ambulatory Visit (HOSPITAL_COMMUNITY): Admission: RE | Admit: 2019-05-02 | Payer: Medicare HMO | Source: Home / Self Care | Admitting: Internal Medicine

## 2019-05-02 ENCOUNTER — Encounter (HOSPITAL_COMMUNITY): Admission: RE | Payer: Self-pay | Source: Home / Self Care

## 2019-05-02 SURGERY — COLONOSCOPY WITH PROPOFOL
Anesthesia: Monitor Anesthesia Care

## 2019-05-05 DIAGNOSIS — G894 Chronic pain syndrome: Secondary | ICD-10-CM | POA: Diagnosis not present

## 2019-05-05 DIAGNOSIS — Z6824 Body mass index (BMI) 24.0-24.9, adult: Secondary | ICD-10-CM | POA: Diagnosis not present

## 2019-05-16 ENCOUNTER — Telehealth: Payer: Self-pay

## 2019-05-16 NOTE — Telephone Encounter (Signed)
Tried to call pt to reschedule TCS w/Propofol w/RMR, no answer, LMOVM for return call.

## 2019-05-18 NOTE — Telephone Encounter (Signed)
Letter mailed to schedule procedure

## 2019-05-31 DIAGNOSIS — E7849 Other hyperlipidemia: Secondary | ICD-10-CM | POA: Diagnosis not present

## 2019-05-31 DIAGNOSIS — Z1389 Encounter for screening for other disorder: Secondary | ICD-10-CM | POA: Diagnosis not present

## 2019-05-31 DIAGNOSIS — Z0001 Encounter for general adult medical examination with abnormal findings: Secondary | ICD-10-CM | POA: Diagnosis not present

## 2019-05-31 DIAGNOSIS — I10 Essential (primary) hypertension: Secondary | ICD-10-CM | POA: Diagnosis not present

## 2019-05-31 DIAGNOSIS — Z23 Encounter for immunization: Secondary | ICD-10-CM | POA: Diagnosis not present

## 2019-05-31 DIAGNOSIS — Z6824 Body mass index (BMI) 24.0-24.9, adult: Secondary | ICD-10-CM | POA: Diagnosis not present

## 2019-05-31 DIAGNOSIS — G894 Chronic pain syndrome: Secondary | ICD-10-CM | POA: Diagnosis not present

## 2019-05-31 DIAGNOSIS — R7309 Other abnormal glucose: Secondary | ICD-10-CM | POA: Diagnosis not present

## 2019-05-31 DIAGNOSIS — R69 Illness, unspecified: Secondary | ICD-10-CM | POA: Diagnosis not present

## 2019-06-03 DIAGNOSIS — Z6824 Body mass index (BMI) 24.0-24.9, adult: Secondary | ICD-10-CM | POA: Diagnosis not present

## 2019-06-03 DIAGNOSIS — Z23 Encounter for immunization: Secondary | ICD-10-CM | POA: Diagnosis not present

## 2019-06-03 DIAGNOSIS — Z1389 Encounter for screening for other disorder: Secondary | ICD-10-CM | POA: Diagnosis not present

## 2019-06-03 DIAGNOSIS — Z0001 Encounter for general adult medical examination with abnormal findings: Secondary | ICD-10-CM | POA: Diagnosis not present

## 2019-06-22 DIAGNOSIS — R978 Other abnormal tumor markers: Secondary | ICD-10-CM | POA: Diagnosis not present

## 2019-06-22 DIAGNOSIS — G894 Chronic pain syndrome: Secondary | ICD-10-CM | POA: Diagnosis not present

## 2019-07-05 ENCOUNTER — Ambulatory Visit (INDEPENDENT_AMBULATORY_CARE_PROVIDER_SITE_OTHER): Payer: Medicare HMO | Admitting: Urology

## 2019-07-05 DIAGNOSIS — N5201 Erectile dysfunction due to arterial insufficiency: Secondary | ICD-10-CM | POA: Diagnosis not present

## 2019-07-05 DIAGNOSIS — R972 Elevated prostate specific antigen [PSA]: Secondary | ICD-10-CM

## 2019-07-26 DIAGNOSIS — G894 Chronic pain syndrome: Secondary | ICD-10-CM | POA: Diagnosis not present

## 2019-08-17 DIAGNOSIS — G894 Chronic pain syndrome: Secondary | ICD-10-CM | POA: Diagnosis not present

## 2019-09-05 ENCOUNTER — Other Ambulatory Visit: Payer: Self-pay

## 2019-09-05 ENCOUNTER — Ambulatory Visit: Payer: Medicare HMO | Attending: Internal Medicine

## 2019-09-05 DIAGNOSIS — Z20822 Contact with and (suspected) exposure to covid-19: Secondary | ICD-10-CM

## 2019-09-05 DIAGNOSIS — Z20828 Contact with and (suspected) exposure to other viral communicable diseases: Secondary | ICD-10-CM | POA: Diagnosis not present

## 2019-09-06 LAB — NOVEL CORONAVIRUS, NAA: SARS-CoV-2, NAA: NOT DETECTED

## 2019-09-07 DIAGNOSIS — G894 Chronic pain syndrome: Secondary | ICD-10-CM | POA: Diagnosis not present

## 2019-10-05 DIAGNOSIS — G894 Chronic pain syndrome: Secondary | ICD-10-CM | POA: Diagnosis not present

## 2019-11-08 DIAGNOSIS — G8929 Other chronic pain: Secondary | ICD-10-CM | POA: Diagnosis not present

## 2019-12-02 DIAGNOSIS — G894 Chronic pain syndrome: Secondary | ICD-10-CM | POA: Diagnosis not present

## 2019-12-22 DIAGNOSIS — G894 Chronic pain syndrome: Secondary | ICD-10-CM | POA: Diagnosis not present

## 2020-01-16 DIAGNOSIS — G894 Chronic pain syndrome: Secondary | ICD-10-CM | POA: Diagnosis not present

## 2020-02-16 DIAGNOSIS — R030 Elevated blood-pressure reading, without diagnosis of hypertension: Secondary | ICD-10-CM | POA: Diagnosis not present

## 2020-02-16 DIAGNOSIS — G894 Chronic pain syndrome: Secondary | ICD-10-CM | POA: Diagnosis not present

## 2020-02-16 DIAGNOSIS — Z6825 Body mass index (BMI) 25.0-25.9, adult: Secondary | ICD-10-CM | POA: Diagnosis not present

## 2020-02-16 DIAGNOSIS — Z1389 Encounter for screening for other disorder: Secondary | ICD-10-CM | POA: Diagnosis not present

## 2020-02-16 DIAGNOSIS — E663 Overweight: Secondary | ICD-10-CM | POA: Diagnosis not present

## 2020-03-02 DIAGNOSIS — G894 Chronic pain syndrome: Secondary | ICD-10-CM | POA: Diagnosis not present

## 2020-04-06 DIAGNOSIS — E7849 Other hyperlipidemia: Secondary | ICD-10-CM | POA: Diagnosis not present

## 2020-04-06 DIAGNOSIS — Z6825 Body mass index (BMI) 25.0-25.9, adult: Secondary | ICD-10-CM | POA: Diagnosis not present

## 2020-04-06 DIAGNOSIS — R978 Other abnormal tumor markers: Secondary | ICD-10-CM | POA: Diagnosis not present

## 2020-04-06 DIAGNOSIS — G8929 Other chronic pain: Secondary | ICD-10-CM | POA: Diagnosis not present

## 2020-04-06 DIAGNOSIS — Z125 Encounter for screening for malignant neoplasm of prostate: Secondary | ICD-10-CM | POA: Diagnosis not present

## 2020-04-06 DIAGNOSIS — I10 Essential (primary) hypertension: Secondary | ICD-10-CM | POA: Diagnosis not present

## 2020-04-06 DIAGNOSIS — R7309 Other abnormal glucose: Secondary | ICD-10-CM | POA: Diagnosis not present

## 2020-05-01 DIAGNOSIS — G894 Chronic pain syndrome: Secondary | ICD-10-CM | POA: Diagnosis not present

## 2020-05-24 ENCOUNTER — Ambulatory Visit: Payer: Medicare HMO | Attending: Internal Medicine

## 2020-05-24 DIAGNOSIS — Z23 Encounter for immunization: Secondary | ICD-10-CM

## 2020-05-24 NOTE — Progress Notes (Signed)
   Covid-19 Vaccination Clinic  Name:  Alexander Petty    MRN: [id number] DOB: [birth year]  05/24/2020  Alexander Petty was observed post Covid-19 immunization for 15 minutes without incident. He was provided with Vaccine Information Sheet and instruction to access the V-Safe system.   Alexander Petty was instructed to call 911 with any severe reactions post vaccine: Marland Kitchen Difficulty breathing  . Swelling of face and throat  . A fast heartbeat  . A bad rash all over body  . Dizziness and weakness

## 2020-05-25 DIAGNOSIS — G894 Chronic pain syndrome: Secondary | ICD-10-CM | POA: Diagnosis not present

## 2020-06-28 DIAGNOSIS — E7849 Other hyperlipidemia: Secondary | ICD-10-CM | POA: Diagnosis not present

## 2020-06-28 DIAGNOSIS — R7309 Other abnormal glucose: Secondary | ICD-10-CM | POA: Diagnosis not present

## 2020-06-28 DIAGNOSIS — Z6825 Body mass index (BMI) 25.0-25.9, adult: Secondary | ICD-10-CM | POA: Diagnosis not present

## 2020-06-28 DIAGNOSIS — Z1331 Encounter for screening for depression: Secondary | ICD-10-CM | POA: Diagnosis not present

## 2020-06-28 DIAGNOSIS — Z23 Encounter for immunization: Secondary | ICD-10-CM | POA: Diagnosis not present

## 2020-06-28 DIAGNOSIS — M1991 Primary osteoarthritis, unspecified site: Secondary | ICD-10-CM | POA: Diagnosis not present

## 2020-06-28 DIAGNOSIS — G8929 Other chronic pain: Secondary | ICD-10-CM | POA: Diagnosis not present

## 2020-06-28 DIAGNOSIS — Z0001 Encounter for general adult medical examination with abnormal findings: Secondary | ICD-10-CM | POA: Diagnosis not present

## 2020-06-28 DIAGNOSIS — I10 Essential (primary) hypertension: Secondary | ICD-10-CM | POA: Diagnosis not present

## 2020-07-19 DIAGNOSIS — E663 Overweight: Secondary | ICD-10-CM | POA: Diagnosis not present

## 2020-07-19 DIAGNOSIS — Z6826 Body mass index (BMI) 26.0-26.9, adult: Secondary | ICD-10-CM | POA: Diagnosis not present

## 2020-07-19 DIAGNOSIS — G894 Chronic pain syndrome: Secondary | ICD-10-CM | POA: Diagnosis not present

## 2020-08-17 DIAGNOSIS — G894 Chronic pain syndrome: Secondary | ICD-10-CM | POA: Diagnosis not present

## 2020-08-17 DIAGNOSIS — Z6826 Body mass index (BMI) 26.0-26.9, adult: Secondary | ICD-10-CM | POA: Diagnosis not present

## 2020-08-17 DIAGNOSIS — E663 Overweight: Secondary | ICD-10-CM | POA: Diagnosis not present

## 2020-09-18 DIAGNOSIS — G894 Chronic pain syndrome: Secondary | ICD-10-CM | POA: Diagnosis not present

## 2020-10-11 DIAGNOSIS — G894 Chronic pain syndrome: Secondary | ICD-10-CM | POA: Diagnosis not present

## 2020-10-11 DIAGNOSIS — E663 Overweight: Secondary | ICD-10-CM | POA: Diagnosis not present

## 2020-10-11 DIAGNOSIS — Z6826 Body mass index (BMI) 26.0-26.9, adult: Secondary | ICD-10-CM | POA: Diagnosis not present

## 2020-10-31 DIAGNOSIS — G894 Chronic pain syndrome: Secondary | ICD-10-CM | POA: Diagnosis not present

## 2020-11-26 DIAGNOSIS — G894 Chronic pain syndrome: Secondary | ICD-10-CM | POA: Diagnosis not present

## 2021-01-01 DIAGNOSIS — M1991 Primary osteoarthritis, unspecified site: Secondary | ICD-10-CM | POA: Diagnosis not present

## 2021-01-01 DIAGNOSIS — I10 Essential (primary) hypertension: Secondary | ICD-10-CM | POA: Diagnosis not present

## 2021-01-01 DIAGNOSIS — R7309 Other abnormal glucose: Secondary | ICD-10-CM | POA: Diagnosis not present

## 2021-01-01 DIAGNOSIS — E7849 Other hyperlipidemia: Secondary | ICD-10-CM | POA: Diagnosis not present

## 2021-01-01 DIAGNOSIS — Z1389 Encounter for screening for other disorder: Secondary | ICD-10-CM | POA: Diagnosis not present

## 2021-01-01 DIAGNOSIS — Z6826 Body mass index (BMI) 26.0-26.9, adult: Secondary | ICD-10-CM | POA: Diagnosis not present

## 2021-01-01 DIAGNOSIS — E782 Mixed hyperlipidemia: Secondary | ICD-10-CM | POA: Diagnosis not present

## 2021-01-01 DIAGNOSIS — Z1331 Encounter for screening for depression: Secondary | ICD-10-CM | POA: Diagnosis not present

## 2021-01-01 DIAGNOSIS — G8929 Other chronic pain: Secondary | ICD-10-CM | POA: Diagnosis not present

## 2021-01-01 DIAGNOSIS — R69 Illness, unspecified: Secondary | ICD-10-CM | POA: Diagnosis not present

## 2021-01-29 DIAGNOSIS — G894 Chronic pain syndrome: Secondary | ICD-10-CM | POA: Diagnosis not present

## 2021-01-29 DIAGNOSIS — Z6826 Body mass index (BMI) 26.0-26.9, adult: Secondary | ICD-10-CM | POA: Diagnosis not present

## 2021-01-29 DIAGNOSIS — E663 Overweight: Secondary | ICD-10-CM | POA: Diagnosis not present

## 2021-02-20 DIAGNOSIS — G894 Chronic pain syndrome: Secondary | ICD-10-CM | POA: Diagnosis not present

## 2021-02-20 DIAGNOSIS — K089 Disorder of teeth and supporting structures, unspecified: Secondary | ICD-10-CM | POA: Diagnosis not present

## 2021-03-14 DIAGNOSIS — G894 Chronic pain syndrome: Secondary | ICD-10-CM | POA: Diagnosis not present

## 2021-04-02 DIAGNOSIS — Z6826 Body mass index (BMI) 26.0-26.9, adult: Secondary | ICD-10-CM | POA: Diagnosis not present

## 2021-04-02 DIAGNOSIS — E663 Overweight: Secondary | ICD-10-CM | POA: Diagnosis not present

## 2021-04-02 DIAGNOSIS — G894 Chronic pain syndrome: Secondary | ICD-10-CM | POA: Diagnosis not present

## 2021-04-29 DIAGNOSIS — G894 Chronic pain syndrome: Secondary | ICD-10-CM | POA: Diagnosis not present

## 2021-05-27 DIAGNOSIS — G894 Chronic pain syndrome: Secondary | ICD-10-CM | POA: Diagnosis not present

## 2021-06-20 DIAGNOSIS — R7309 Other abnormal glucose: Secondary | ICD-10-CM | POA: Diagnosis not present

## 2021-06-20 DIAGNOSIS — G894 Chronic pain syndrome: Secondary | ICD-10-CM | POA: Diagnosis not present

## 2021-06-20 DIAGNOSIS — Z23 Encounter for immunization: Secondary | ICD-10-CM | POA: Diagnosis not present

## 2021-06-20 DIAGNOSIS — G8929 Other chronic pain: Secondary | ICD-10-CM | POA: Diagnosis not present

## 2021-06-20 DIAGNOSIS — Z125 Encounter for screening for malignant neoplasm of prostate: Secondary | ICD-10-CM | POA: Diagnosis not present

## 2021-06-20 DIAGNOSIS — E782 Mixed hyperlipidemia: Secondary | ICD-10-CM | POA: Diagnosis not present

## 2021-06-20 DIAGNOSIS — R69 Illness, unspecified: Secondary | ICD-10-CM | POA: Diagnosis not present

## 2021-06-20 DIAGNOSIS — Z6825 Body mass index (BMI) 25.0-25.9, adult: Secondary | ICD-10-CM | POA: Diagnosis not present

## 2021-06-20 DIAGNOSIS — M1991 Primary osteoarthritis, unspecified site: Secondary | ICD-10-CM | POA: Diagnosis not present

## 2021-06-20 DIAGNOSIS — I10 Essential (primary) hypertension: Secondary | ICD-10-CM | POA: Diagnosis not present

## 2021-06-20 DIAGNOSIS — F419 Anxiety disorder, unspecified: Secondary | ICD-10-CM | POA: Diagnosis not present

## 2021-06-20 DIAGNOSIS — E789 Disorder of lipoprotein metabolism, unspecified: Secondary | ICD-10-CM | POA: Diagnosis not present

## 2021-07-10 DIAGNOSIS — G894 Chronic pain syndrome: Secondary | ICD-10-CM | POA: Diagnosis not present

## 2021-07-31 DIAGNOSIS — R69 Illness, unspecified: Secondary | ICD-10-CM | POA: Diagnosis not present

## 2021-07-31 DIAGNOSIS — G894 Chronic pain syndrome: Secondary | ICD-10-CM | POA: Diagnosis not present

## 2021-07-31 DIAGNOSIS — I10 Essential (primary) hypertension: Secondary | ICD-10-CM | POA: Diagnosis not present

## 2021-08-27 DIAGNOSIS — G894 Chronic pain syndrome: Secondary | ICD-10-CM | POA: Diagnosis not present

## 2021-08-27 DIAGNOSIS — G8929 Other chronic pain: Secondary | ICD-10-CM | POA: Diagnosis not present

## 2021-09-18 DIAGNOSIS — G894 Chronic pain syndrome: Secondary | ICD-10-CM | POA: Diagnosis not present

## 2021-10-09 DIAGNOSIS — G894 Chronic pain syndrome: Secondary | ICD-10-CM | POA: Diagnosis not present

## 2021-10-31 DIAGNOSIS — E663 Overweight: Secondary | ICD-10-CM | POA: Diagnosis not present

## 2021-10-31 DIAGNOSIS — G8929 Other chronic pain: Secondary | ICD-10-CM | POA: Diagnosis not present

## 2021-12-02 DIAGNOSIS — G894 Chronic pain syndrome: Secondary | ICD-10-CM | POA: Diagnosis not present

## 2021-12-25 DIAGNOSIS — G894 Chronic pain syndrome: Secondary | ICD-10-CM | POA: Diagnosis not present

## 2021-12-25 DIAGNOSIS — I10 Essential (primary) hypertension: Secondary | ICD-10-CM | POA: Diagnosis not present

## 2022-01-21 DIAGNOSIS — G894 Chronic pain syndrome: Secondary | ICD-10-CM | POA: Diagnosis not present

## 2022-01-21 DIAGNOSIS — E7849 Other hyperlipidemia: Secondary | ICD-10-CM | POA: Diagnosis not present

## 2022-01-21 DIAGNOSIS — Z6824 Body mass index (BMI) 24.0-24.9, adult: Secondary | ICD-10-CM | POA: Diagnosis not present

## 2022-01-21 DIAGNOSIS — F419 Anxiety disorder, unspecified: Secondary | ICD-10-CM | POA: Diagnosis not present

## 2022-01-21 DIAGNOSIS — Z1331 Encounter for screening for depression: Secondary | ICD-10-CM | POA: Diagnosis not present

## 2022-01-21 DIAGNOSIS — R69 Illness, unspecified: Secondary | ICD-10-CM | POA: Diagnosis not present

## 2022-01-21 DIAGNOSIS — Z0001 Encounter for general adult medical examination with abnormal findings: Secondary | ICD-10-CM | POA: Diagnosis not present

## 2022-01-21 DIAGNOSIS — E782 Mixed hyperlipidemia: Secondary | ICD-10-CM | POA: Diagnosis not present

## 2022-01-21 DIAGNOSIS — I10 Essential (primary) hypertension: Secondary | ICD-10-CM | POA: Diagnosis not present

## 2022-01-21 DIAGNOSIS — M1991 Primary osteoarthritis, unspecified site: Secondary | ICD-10-CM | POA: Diagnosis not present

## 2022-01-21 DIAGNOSIS — R7309 Other abnormal glucose: Secondary | ICD-10-CM | POA: Diagnosis not present

## 2022-02-12 DIAGNOSIS — G894 Chronic pain syndrome: Secondary | ICD-10-CM | POA: Diagnosis not present

## 2022-03-05 DIAGNOSIS — G894 Chronic pain syndrome: Secondary | ICD-10-CM | POA: Diagnosis not present

## 2022-04-02 DIAGNOSIS — G894 Chronic pain syndrome: Secondary | ICD-10-CM | POA: Diagnosis not present

## 2022-04-22 DIAGNOSIS — G894 Chronic pain syndrome: Secondary | ICD-10-CM | POA: Diagnosis not present

## 2022-05-14 DIAGNOSIS — G894 Chronic pain syndrome: Secondary | ICD-10-CM | POA: Diagnosis not present

## 2022-06-09 DIAGNOSIS — G894 Chronic pain syndrome: Secondary | ICD-10-CM | POA: Diagnosis not present

## 2022-06-25 DIAGNOSIS — G894 Chronic pain syndrome: Secondary | ICD-10-CM | POA: Diagnosis not present

## 2022-06-25 DIAGNOSIS — Z0100 Encounter for examination of eyes and vision without abnormal findings: Secondary | ICD-10-CM | POA: Diagnosis not present

## 2022-06-26 DIAGNOSIS — Z118 Encounter for screening for other infectious and parasitic diseases: Secondary | ICD-10-CM | POA: Diagnosis not present

## 2022-07-25 DIAGNOSIS — H02831 Dermatochalasis of right upper eyelid: Secondary | ICD-10-CM | POA: Diagnosis not present

## 2022-07-25 DIAGNOSIS — H524 Presbyopia: Secondary | ICD-10-CM | POA: Diagnosis not present

## 2022-07-25 DIAGNOSIS — D3141 Benign neoplasm of right ciliary body: Secondary | ICD-10-CM | POA: Diagnosis not present

## 2022-07-25 DIAGNOSIS — H02834 Dermatochalasis of left upper eyelid: Secondary | ICD-10-CM | POA: Diagnosis not present

## 2022-07-25 DIAGNOSIS — H2513 Age-related nuclear cataract, bilateral: Secondary | ICD-10-CM | POA: Diagnosis not present

## 2022-07-29 DIAGNOSIS — Z0001 Encounter for general adult medical examination with abnormal findings: Secondary | ICD-10-CM | POA: Diagnosis not present

## 2022-07-29 DIAGNOSIS — Z6823 Body mass index (BMI) 23.0-23.9, adult: Secondary | ICD-10-CM | POA: Diagnosis not present

## 2022-07-29 DIAGNOSIS — Z125 Encounter for screening for malignant neoplasm of prostate: Secondary | ICD-10-CM | POA: Diagnosis not present

## 2022-07-29 DIAGNOSIS — G8929 Other chronic pain: Secondary | ICD-10-CM | POA: Diagnosis not present

## 2022-07-29 DIAGNOSIS — I10 Essential (primary) hypertension: Secondary | ICD-10-CM | POA: Diagnosis not present

## 2022-07-29 DIAGNOSIS — M1991 Primary osteoarthritis, unspecified site: Secondary | ICD-10-CM | POA: Diagnosis not present

## 2022-07-29 DIAGNOSIS — E782 Mixed hyperlipidemia: Secondary | ICD-10-CM | POA: Diagnosis not present

## 2022-08-17 ENCOUNTER — Encounter (HOSPITAL_COMMUNITY): Payer: Self-pay

## 2022-08-17 ENCOUNTER — Other Ambulatory Visit: Payer: Self-pay

## 2022-08-17 ENCOUNTER — Emergency Department (HOSPITAL_COMMUNITY)
Admission: EM | Admit: 2022-08-17 | Discharge: 2022-08-17 | Discharge status: Against Medical Advice | Payer: Medicare HMO | Attending: Emergency Medicine | Admitting: Emergency Medicine

## 2022-08-17 ENCOUNTER — Emergency Department (HOSPITAL_COMMUNITY): Payer: Medicare HMO

## 2022-08-17 DIAGNOSIS — Z5321 Procedure and treatment not carried out due to patient leaving prior to being seen by health care provider: Secondary | ICD-10-CM

## 2022-08-17 DIAGNOSIS — Z8719 Personal history of other diseases of the digestive system: Secondary | ICD-10-CM | POA: Diagnosis not present

## 2022-08-17 DIAGNOSIS — Z7982 Long term (current) use of aspirin: Secondary | ICD-10-CM | POA: Diagnosis not present

## 2022-08-17 DIAGNOSIS — Z5329 Procedure and treatment not carried out because of patient's decision for other reasons: Secondary | ICD-10-CM | POA: Diagnosis not present

## 2022-08-17 DIAGNOSIS — K5641 Fecal impaction: Secondary | ICD-10-CM | POA: Diagnosis not present

## 2022-08-17 MED ORDER — FENTANYL CITRATE PF 50 MCG/ML IJ SOSY
50.0000 ug | PREFILLED_SYRINGE | Freq: Once | INTRAMUSCULAR | Status: AC
  Administered 2022-08-17: 50 ug via INTRAMUSCULAR
  Filled 2022-08-17: qty 1

## 2022-08-17 NOTE — ED Provider Notes (Signed)
[facility] EMERGENCY DEPARTMENT Provider Note   CSN: [id number] Arrival date & time: 08/17/22  1345     History Chief Complaint  Patient presents with   Fecal Impaction    Alexander Petty is a 71 y.o. male with h/o chronic opioid use for the past 5-10 years presents to the ER for evaluation of possible fecal impaction. The patient reports that he has not had a bowel movement in the past 3 days. Denies any abdominal pain, nausea, or vomiting. Denies any fevers. He denies any previous episode of this. He reports that he tried a "pill" from [facility] but hasn't helped. He reports that he tried an enema although he couldn't use it since his rectum was full of stool. Does not take any stool softeners daily.    HPI     Home Medications Prior to Admission medications   Medication Sig Start Date End Date Taking? Authorizing Provider  Ascorbic Acid (VITAMIN C) 1000 MG tablet Take 1,000 mg by mouth daily.    [provider]  aspirin EC 81 MG tablet Take 81 mg by mouth daily.    [provider]  HYDROcodone-acetaminophen (NORCO) 10-325 MG tablet Take 1 tablet by mouth 4 (four) times daily.    [provider]  ibuprofen (ADVIL,MOTRIN) 800 MG tablet Take 800 mg by mouth every 8 (eight) hours as needed for moderate pain.     [provider]  Omega-3 Fatty Acids (FISH OIL PO) Take 360 mg by mouth daily.     [provider]      Allergies    Patient has no known allergies.    Review of Systems   Review of Systems  Constitutional:  Negative for chills and fever.  Gastrointestinal:  Positive for constipation and rectal pain. Negative for abdominal pain, nausea and vomiting.    Physical Exam Updated Vital Signs Ht '5\' 7"'$  (1.702 m)   Wt 74.8 kg   BMI 25.84 kg/m  Physical Exam Vitals and nursing note reviewed. Exam conducted with a chaperone present Lyn Henri, RN).  Constitutional:      Appearance: He is not toxic-appearing.     Comments:  Uncomfortable, but not toxic appearing.   HENT:     Mouth/Throat:     Mouth: Mucous membranes are moist.  Eyes:     General: No scleral icterus. Pulmonary:     Effort: Pulmonary effort is normal. No respiratory distress.  Abdominal:     Palpations: Abdomen is soft.     Tenderness: There is no abdominal tenderness. There is no guarding or rebound.  Genitourinary:      Comments: Large stool noted already protruding from the anus. He does have a hemorrhoid at the marked location above.  Skin:    General: Skin is dry.     Findings: No rash.  Neurological:     General: No focal deficit present.     Mental Status: He is alert. Mental status is at baseline.  Psychiatric:        Mood and Affect: Mood normal.     ED Results / Procedures / Treatments   Labs (all labs ordered are listed, but only abnormal results are displayed) Labs Reviewed - No data to display  EKG None  Radiology DG Abdomen 1 View  Result Date: 08/17/2022 CLINICAL DATA:  Fecal impaction EXAM: ABDOMEN - 1 VIEW COMPARISON:  CT abdomen/pelvis 06/17/2018 FINDINGS: No evidence of bowel obstruction. Formed stool overlies the rectum consistent with constipation. The stool burden  is moderate. No large rectal stool ball. No free air. The lung bases are clear. No organomegaly or abnormal calcifications. No acute osseous abnormality. IMPRESSION: Moderate volume of formed stool overlies the rectum consistent with constipation. No evidence of obstruction. Electronically Signed   By: Jacqulynn Cadet M.D.   On: 08/17/2022 14:59    Procedures Fecal disimpaction  Date/Time: 08/17/2022 7:59 PM  Performed by: Sherrell Puller, PA-C Authorized by: Sherrell Puller, PA-C  Consent: Verbal consent obtained. Risks and benefits: risks, benefits and alternatives were discussed Consent given by: patient Patient understanding: patient states understanding of the procedure being performed Imaging studies: imaging studies available Patient  identity confirmed: verbally with patient Comments: Large amount of clay/brown color stool noted. Three firm stool balls noted with liquid stool surrounding. I was able to break up the remaining stool I could reach. While doing this disimpaction, his hemorrhoid started to bleed. Washcloth placed on the area. Will re-assess.      Medications Ordered in ED Medications  fentaNYL (SUBLIMAZE) injection 50 mcg (50 mcg Intramuscular Given 08/17/22 1722)    ED Course/ Medical Decision Making/ A&P                           Medical Decision Making Amount and/or Complexity of Data Reviewed Radiology: ordered.  Risk Prescription drug management.   71 year old male presents the emergency room today for evaluation of rectal pain and possible fecal impaction.  Differential diagnosis includes was not limited to fecal impaction, small bowel obstruction, constipation.  Vital signs show elevated blood pressure 117/79 otherwise afebrile normal pulse rate, satting well room air without increased work of breathing.  Physical exam as noted above.  Fentanyl was given beforehand. On evaluation, the patient had a stool already protruding from the rectum that was large.  With some lubricant I was able to remove 3 large stool balls that provided great relief with the patient.  I was able to break up the remaining stool noted.  He does have some liquid stool as well.  Upon the digital disimpaction, one of his hemorrhoids did start bleeding.  Use washcloth on the area.  Will reassess to see if the bleeding has stopped. The patient reports that his pain is relieved.   Alerted by nursing the patient has eloped.   Final Clinical Impression(s) / ED Diagnoses Final diagnoses:  Eloped from emergency department  Fecal impaction [facility])    Rx / DC Orders ED Discharge Orders     None         Sherrell Puller, Hershal Coria 08/17/22 2003    Fransico Meadow, MD 08/18/22 1049

## 2022-08-17 NOTE — ED Provider Triage Note (Signed)
Emergency Medicine Provider Triage Evaluation Note  Alexander Petty , a 71 y.o. male  was evaluated in triage.  Pt complains of rectal pain for the past three days. He is on chronic opioid use. He reports that he has not had this before, but feels that he is impacted.  Review of Systems  Positive:  Negative:   Physical Exam  Ht '5\' 7"'$  (1.702 m)   Wt 74.8 kg   BMI 25.84 kg/m  Gen:   Awake, no distress   Resp:  Normal effort  MSK:   Moves extremities without difficulty  Other:  Abdomen soft. Non tender  Medical Decision Making  Medically screening exam initiated at 2:34 PM.  Appropriate orders placed.  Loretha Brasil was informed that the remainder of the evaluation will be completed by another provider, this initial triage assessment does not replace that evaluation, and the importance of remaining in the ED until their evaluation is complete.  XR ordred.    Sherrell Puller, PA-C 08/17/22 1436

## 2022-08-17 NOTE — ED Triage Notes (Signed)
"  Last BM was 2-3 days ago and having a hard time now. Tried an enema but I am impacted so I couldn't do it. Now my stomach is so miserable I can't stand it" per pt

## 2022-08-18 DIAGNOSIS — G894 Chronic pain syndrome: Secondary | ICD-10-CM | POA: Diagnosis not present

## 2022-09-17 DIAGNOSIS — G894 Chronic pain syndrome: Secondary | ICD-10-CM | POA: Diagnosis not present

## 2022-10-16 DIAGNOSIS — G894 Chronic pain syndrome: Secondary | ICD-10-CM | POA: Diagnosis not present

## 2022-11-12 DIAGNOSIS — G894 Chronic pain syndrome: Secondary | ICD-10-CM | POA: Diagnosis not present

## 2022-12-10 DIAGNOSIS — G894 Chronic pain syndrome: Secondary | ICD-10-CM | POA: Diagnosis not present

## 2023-01-01 DIAGNOSIS — E7849 Other hyperlipidemia: Secondary | ICD-10-CM | POA: Diagnosis not present

## 2023-01-01 DIAGNOSIS — R7309 Other abnormal glucose: Secondary | ICD-10-CM | POA: Diagnosis not present

## 2023-01-01 DIAGNOSIS — R69 Illness, unspecified: Secondary | ICD-10-CM | POA: Diagnosis not present

## 2023-01-01 DIAGNOSIS — E782 Mixed hyperlipidemia: Secondary | ICD-10-CM | POA: Diagnosis not present

## 2023-01-01 DIAGNOSIS — G894 Chronic pain syndrome: Secondary | ICD-10-CM | POA: Diagnosis not present

## 2023-01-01 DIAGNOSIS — I10 Essential (primary) hypertension: Secondary | ICD-10-CM | POA: Diagnosis not present

## 2023-01-11 ENCOUNTER — Other Ambulatory Visit: Payer: Self-pay

## 2023-01-11 ENCOUNTER — Emergency Department (HOSPITAL_COMMUNITY)
Admission: EM | Admit: 2023-01-11 | Discharge: 2023-01-11 | Disposition: A | Discharge status: Home / Self Care | Payer: Medicare HMO | Attending: Emergency Medicine | Admitting: Emergency Medicine

## 2023-01-11 DIAGNOSIS — S61411A Laceration without foreign body of right hand, initial encounter: Secondary | ICD-10-CM | POA: Insufficient documentation

## 2023-01-11 DIAGNOSIS — S6991XA Unspecified injury of right wrist, hand and finger(s), initial encounter: Secondary | ICD-10-CM | POA: Diagnosis not present

## 2023-01-11 DIAGNOSIS — Z7982 Long term (current) use of aspirin: Secondary | ICD-10-CM | POA: Diagnosis not present

## 2023-01-11 DIAGNOSIS — W268XXA Contact with other sharp object(s), not elsewhere classified, initial encounter: Secondary | ICD-10-CM | POA: Diagnosis not present

## 2023-01-11 MED ORDER — LIDOCAINE HCL (PF) 2 % IJ SOLN: 5.0000 mL | Freq: Once | INTRAMUSCULAR | Status: AC

## 2023-01-11 MED ORDER — LIDOCAINE HCL (PF) 2 % IJ SOLN
INTRAMUSCULAR | Status: AC
  Administered 2023-01-11: 5 mL
  Filled 2023-01-11: qty 10

## 2023-01-11 NOTE — ED Provider Notes (Signed)
[place] [facility]ERGENCY DEPARTMENT AT [facility] Provider Note   CSN: [id number] Arrival date & time: 01/11/23  1844     History  Chief Complaint  Patient presents with   Laceration    Alexander Petty is a 72 y.o. male.  Patient complains of a laceration to his right hand.  Patient reports he was cleaning a food processor blade and cut the middle of his right palm.  Patient reports the area has continued bleeding.  Patient reports his tetanus shot is up-to-date.  Patient takes an aspirin daily.  The history is provided by the patient. No language interpreter was used.  Laceration Location:  Hand Hand laceration location:  R hand Length:  0.8 Depth:  Through dermis Quality: straight   Bleeding: controlled   Time since incident:  2 hours Pain details:    Quality:  Aching Foreign body present:  No foreign bodies Relieved by:  Nothing Tetanus status:  Up to date      Home Medications Prior to Admission medications   Medication Sig Start Date End Date Taking? Authorizing Provider  Ascorbic Acid (VITAMIN C) 1000 MG tablet Take 1,000 mg by mouth daily.    [provider]  aspirin EC 81 MG tablet Take 81 mg by mouth daily.    [provider]  HYDROcodone-acetaminophen (NORCO) 10-325 MG tablet Take 1 tablet by mouth 4 (four) times daily.    [provider]  ibuprofen (ADVIL,MOTRIN) 800 MG tablet Take 800 mg by mouth every 8 (eight) hours as needed for moderate pain.     [provider]  Omega-3 Fatty Acids (FISH OIL PO) Take 360 mg by mouth daily.     [provider]      Allergies    Patient has no known allergies.    Review of Systems   Review of Systems  All other systems reviewed and are negative.   Physical Exam Updated Vital Signs BP (!) 217/87   Pulse 70   Temp 98 F (36.7 C)   Resp 17   SpO2 97%  Physical Exam Vitals and nursing note reviewed.  Constitutional:      Appearance: He is well-developed.   HENT:     Head: Normocephalic.  Pulmonary:     Effort: Pulmonary effort is normal.  Abdominal:     General: There is no distension.  Musculoskeletal:        General: Normal range of motion.     Cervical back: Normal range of motion.     Comments: Patient has full range of motion of right hand neurovascular neurosensory are intact  Skin:    Comments: 8 mm laceration right mid palm  Neurological:     General: No focal deficit present.     Mental Status: He is alert and oriented to person, place, and time.  Psychiatric:        Mood and Affect: Mood normal.     ED Results / Procedures / Treatments   Labs (all labs ordered are listed, but only abnormal results are displayed) Labs Reviewed - No data to display  EKG None  Radiology No results found.  Procedures .Marland KitchenLaceration Repair  Date/Time: 01/11/2023 9:43 PM  Performed by: Elson Areas, PA-C Authorized by: Elson Areas, PA-C   Consent:    Consent obtained:  Verbal   Consent given by:  Patient   Risks, benefits, and alternatives were discussed: yes     Risks discussed:  Infection   Alternatives  discussed:  No treatment Universal protocol:    Procedure explained and questions answered to patient or proxy's satisfaction: yes     Immediately prior to procedure, a time out was called: yes     Patient identity confirmed:  Verbally with patient Anesthesia:    Anesthesia method:  Local infiltration   Local anesthetic:  Lidocaine 1% w/o epi Laceration details:    Location:  Hand   Hand location:  R palm   Length (cm):  0.8 Pre-procedure details:    Preparation:  Patient was prepped and draped in usual sterile fashion Exploration:    Limited defect created (wound extended): no     Wound exploration: wound explored through full range of motion     Contaminated: no   Treatment:    Area cleansed with:  Povidone-iodine   Amount of cleaning:  Standard   Irrigation solution:  Sterile saline   Debridement:  None    Undermining:  None Skin repair:    Repair method:  Sutures   Suture size:  5-0   Suture material:  Prolene   Number of sutures:  2 Approximation:    Approximation:  Close Repair type:    Repair type:  Simple Post-procedure details:    Procedure completion:  Tolerated     Medications Ordered in ED Medications  lidocaine HCl (PF) (XYLOCAINE) 2 % injection 5 mL (5 mLs Infiltration Given 01/11/23 2051)    ED Course/ Medical Decision Making/ A&P                             Medical Decision Making Patient reports he cut his right palm with a food processor blade  Risk OTC drugs. Prescription drug management. Risk Details: Wound closed with 2 sutures.  No bleeding postprocedure bandaged and advised suture removal in 8 days           Final Clinical Impression(s) / ED Diagnoses Final diagnoses:  Laceration of right hand, foreign body presence unspecified, initial encounter    Rx / DC Orders ED Discharge Orders     None     An After Visit Summary was printed and given to the patient.     Osie Cheeks 01/11/23 2145    Bethann Berkshire, MD 01/15/23 1202

## 2023-01-11 NOTE — Discharge Instructions (Signed)
Suture removal in 8 days  °

## 2023-01-11 NOTE — ED Triage Notes (Signed)
Patient cut the right palm of his hand with a food processor blade.Bleeding controlled on arrival.

## 2023-01-20 ENCOUNTER — Ambulatory Visit
Admission: EM | Admit: 2023-01-20 | Discharge: 2023-01-20 | Disposition: A | Discharge status: Home / Self Care | Payer: Medicare HMO

## 2023-01-20 ENCOUNTER — Encounter: Payer: Self-pay | Admitting: Emergency Medicine

## 2023-01-20 DIAGNOSIS — Z4802 Encounter for removal of sutures: Secondary | ICD-10-CM

## 2023-01-20 DIAGNOSIS — I10 Essential (primary) hypertension: Secondary | ICD-10-CM | POA: Diagnosis not present

## 2023-01-20 NOTE — ED Provider Notes (Signed)
Patient presents today for suture removal.  Reports 9 days ago, he had 2 sutures put in his right hand after he sustained a cut while cleaning a food processor blade.  Reports he was seen in the emergency room at any pain hospital.  Reports since that time, he has been keeping the area clean and dry and has been applying a little bit of Neosporin and a Band-Aid over the area.  The area is not painful, is not draining, and patient denies signs of infection.  No pain in the hand.  2 sutures were visualized in the right palm, wound appears clean dry and intact.  Sutures removed today without incident, patient tolerated well.  Ongoing wound care discussed with patient.  Return for worsening signs and symptoms.   Valentino Nose, NP 01/20/23 [phone]

## 2023-01-20 NOTE — ED Notes (Signed)
Sutures removed by provider

## 2023-01-20 NOTE — ED Triage Notes (Signed)
Here for sutures removal.  2 sutures in right hand.  Sutures were placed by [facility].

## 2023-01-20 NOTE — Discharge Instructions (Addendum)
We remove the stitches from your hand today.

## 2023-02-02 DIAGNOSIS — G894 Chronic pain syndrome: Secondary | ICD-10-CM | POA: Diagnosis not present

## 2023-02-17 DIAGNOSIS — R69 Illness, unspecified: Secondary | ICD-10-CM | POA: Diagnosis not present

## 2023-03-04 DIAGNOSIS — G894 Chronic pain syndrome: Secondary | ICD-10-CM | POA: Diagnosis not present

## 2023-03-26 DIAGNOSIS — R69 Illness, unspecified: Secondary | ICD-10-CM | POA: Diagnosis not present

## 2023-03-31 DIAGNOSIS — G894 Chronic pain syndrome: Secondary | ICD-10-CM | POA: Diagnosis not present

## 2023-03-31 DIAGNOSIS — Z6823 Body mass index (BMI) 23.0-23.9, adult: Secondary | ICD-10-CM | POA: Diagnosis not present

## 2023-04-27 DIAGNOSIS — G894 Chronic pain syndrome: Secondary | ICD-10-CM | POA: Diagnosis not present

## 2023-05-25 DIAGNOSIS — G894 Chronic pain syndrome: Secondary | ICD-10-CM | POA: Diagnosis not present

## 2023-06-12 DIAGNOSIS — G894 Chronic pain syndrome: Secondary | ICD-10-CM | POA: Diagnosis not present

## 2023-07-02 DIAGNOSIS — Z6823 Body mass index (BMI) 23.0-23.9, adult: Secondary | ICD-10-CM | POA: Diagnosis not present

## 2023-07-02 DIAGNOSIS — G894 Chronic pain syndrome: Secondary | ICD-10-CM | POA: Diagnosis not present

## 2023-07-29 DIAGNOSIS — G894 Chronic pain syndrome: Secondary | ICD-10-CM | POA: Diagnosis not present

## 2023-07-30 ENCOUNTER — Ambulatory Visit
Admission: EM | Admit: 2023-07-30 | Discharge: 2023-07-30 | Disposition: A | Discharge status: Home / Self Care | Payer: Medicare HMO | Attending: Nurse Practitioner | Admitting: Nurse Practitioner

## 2023-07-30 DIAGNOSIS — Z23 Encounter for immunization: Secondary | ICD-10-CM | POA: Diagnosis not present

## 2023-07-30 DIAGNOSIS — S61211A Laceration without foreign body of left index finger without damage to nail, initial encounter: Secondary | ICD-10-CM | POA: Diagnosis not present

## 2023-07-30 DIAGNOSIS — Z1211 Encounter for screening for malignant neoplasm of colon: Secondary | ICD-10-CM

## 2023-07-30 MED ORDER — TETANUS-DIPHTH-ACELL PERTUSSIS 5-2.5-18.5 LF-MCG/0.5 IM SUSY
0.5000 mL | PREFILLED_SYRINGE | Freq: Once | INTRAMUSCULAR | Status: AC
  Administered 2023-07-30: 0.5 mL via INTRAMUSCULAR

## 2023-07-30 NOTE — ED Provider Notes (Signed)
[facility]    CSN: [id number] Arrival date & time: 07/30/23  1627      History   Chief Complaint No chief complaint on file.   HPI Alexander Petty is a 71 y.o. male.   The history is provided by the patient.   Patient presents with an injury to the left index finger.  Patient states he was cutting meat, and the knife cut the left index finger at the tip.  Patient denies pain, numbness, tingling, swelling, decreased range of motion of the finger, or bruising.  Patient reports that he is unsure of when he had his last tetanus shot.  Patient reports that he is currently taking aspirin 81 mg daily.  Past Medical History:  Diagnosis Date   Hypertension     Patient Active Problem List   Diagnosis Date Noted   Encounter for screening colonoscopy 10/28/2018    Past Surgical History:  Procedure Laterality Date   INGUINAL HERNIA REPAIR     bilateral as a child   KNEE SURGERY Bilateral    ROTATOR CUFF REPAIR     UMBILICAL HERNIA REPAIR         Home Medications    Prior to Admission medications   Medication Sig Start Date End Date Taking? Authorizing Provider  Ascorbic Acid (VITAMIN C) 1000 MG tablet Take 1,000 mg by mouth daily.    [provider]  aspirin EC 81 MG tablet Take 81 mg by mouth daily.    [provider]  HYDROcodone-acetaminophen (NORCO) 10-325 MG tablet Take 1 tablet by mouth 4 (four) times daily.    [provider]  ibuprofen (ADVIL,MOTRIN) 800 MG tablet Take 800 mg by mouth every 8 (eight) hours as needed for moderate pain.     [provider]  Omega-3 Fatty Acids (FISH OIL PO) Take 360 mg by mouth daily.     [provider]    Family History Family History  Problem Relation Age of Onset   Colon polyps Neg Hx    Colon cancer Neg Hx     Social History Social History   Tobacco Use   Smoking status: Never   Smokeless tobacco: Never  Substance Use Topics   Alcohol use: No   Drug use: No      Allergies   Patient has no known allergies.   Review of Systems Review of Systems Per HPI  Physical Exam Triage Vital Signs ED Triage Vitals  Encounter Vitals Group     BP 07/30/23 1655 (!) 222/97     Systolic BP Percentile --      Diastolic BP Percentile --      Pulse Rate 07/30/23 1655 71     Resp 07/30/23 1655 16     Temp 07/30/23 1655 98 F (36.7 C)     Temp Source 07/30/23 1655 Oral     SpO2 07/30/23 1655 96 %     Weight --      Height --      Head Circumference --      Peak Flow --      Pain Score 07/30/23 1657 0     Pain Loc --      Pain Education --      Exclude from Growth Chart --    No data found.  Updated Vital Signs BP (!) 219/95 (BP Location: Right Arm) Comment: Nicoletta Ba, NP notified  Pulse 71   Temp 98 F (36.7 C) (Oral)   Resp 16  SpO2 96%   Visual Acuity Right Eye Distance:   Left Eye Distance:   Bilateral Distance:    Right Eye Near:   Left Eye Near:    Bilateral Near:     Physical Exam Vitals and nursing note reviewed.  Constitutional:      General: He is not in acute distress.    Appearance: Normal appearance.  Eyes:     Extraocular Movements: Extraocular movements intact.     Pupils: Pupils are equal, round, and reactive to light.  Pulmonary:     Effort: Pulmonary effort is normal.  Musculoskeletal:     Cervical back: Normal range of motion.  Skin:    General: Skin is warm and dry.     Findings: Laceration present.     Comments: Splice injury noted to the medial distal tip of the left index finger.  No nailbed involvement.  Bleeding at this time.  Neurological:     General: No focal deficit present.     Mental Status: He is alert and oriented to person, place, and time.  Psychiatric:        Mood and Affect: Mood normal.        Behavior: Behavior normal.      UC Treatments / Results  Labs (all labs ordered are listed, but only abnormal results are displayed) Labs Reviewed - No data to  display  EKG   Radiology No results found.  Procedures Laceration Repair  Date/Time: 07/30/2023 5:32 PM  Performed by: Abran Cantor, NP Authorized by: Abran Cantor, NP   Consent:    Consent obtained:  Verbal   Consent given by:  Patient   Risks discussed:  Infection, pain, need for additional repair and poor wound healing Universal protocol:    Procedure explained and questions answered to patient or proxy's satisfaction: yes     Patient identity confirmed:  Verbally with patient Anesthesia:    Anesthesia method:  None Laceration details:    Location:  Finger   Finger location:  L index finger Exploration:    Limited defect created (wound extended): no     Contaminated: no   Treatment:    Wound cleansed with: Sterile water and Hibiclens soap.   Amount of cleaning:  Standard   Irrigation solution:  Tap water Skin repair:    Repair method:  Steri-Strips   Number of Steri-Strips:  5 Approximation:    Approximation:  Close Repair type:    Repair type:  Simple Post-procedure details:    Dressing:  Adhesive bandage Comments:     "Splice "injury noted to the distal tip of the left index finger.  Attempted use of Dermabond adhesive; however Dermabond would not adhere to the wound.  5 Steri-Strips were applied along with use of Surgifoam, bleeding was controlled.  Nonadhesive dressing with Coban gauze applied to the left index finger.   (including critical care time)  Medications Ordered in UC Medications  Tdap (BOOSTRIX) injection 0.5 mL (0.5 mLs Intramuscular Given 07/30/23 1731)    Initial Impression / Assessment and Plan / UC Course  I have reviewed the triage vital signs and the nursing notes.  Pertinent labs & imaging results that were available during my care of the patient were reviewed by me and considered in my medical decision making (see chart for details).  Splice injury noted to the left index finger.  Injury was repaired with 5  Steri-Strips and surgi-foam.  Bleeding controlled.  Tdap was also updated.  Supportive care  recommendations were provided and discussed with the patient to include leaving the dressing in place for the next 24 hours, cleansing the area with warm water, over-the-counter Tylenol for pain or discomfort, and following up for signs of infection.  Patient was also asked about his blood pressure.  He denies chest pain, shortness of breath, dizziness, headache, or lower extremity edema.  Patient reports he is scheduled to see his PCP in the next 2 weeks.  Patient was advised to keep scheduled appointment with PCP to discuss hypertension.  Discussed signs of infection with the patient and when follow-up be necessary.  Patient is in agreement with this plan of care and verbalized understanding.  All questions were answered.  Patient stable for discharge.   Final Clinical Impressions(s) / UC Diagnoses   Final diagnoses:  None   Discharge Instructions   None    ED Prescriptions   None    PDMP not reviewed this encounter.   Abran Cantor, NP 07/30/23 1743

## 2023-07-30 NOTE — ED Triage Notes (Signed)
Reports he was cutting meat and cut his left index finger on a knife today.    Last tetanus unknown

## 2023-07-30 NOTE — Discharge Instructions (Addendum)
5 Steri-Strips were placed to the left index finger.  Do not remove the dressing for the next 24 hours, do not remove the Steri-Strips, let them fall off on their own. Your tetanus shot has been updated today.  It is good for the next 10 years. Remove the dressing and clean the area with warm water in 24 hours.  When you are at home, may leave the area open to air.  When you are out, recommend keeping the area covered. May apply Neosporin to the lacerations as needed. May take over-the-counter Tylenol for pain or discomfort. Follow-up immediately if you develop swelling, increased redness that goes into the hand or up the arm, drainage, or if you develop fever, chills, or other concerns. As discussed, please contact your PCP office tomorrow to see if you can be seen in an earlier time for grossly elevated blood pressure. Go to the emergency department immediately if you experience chest pain, shortness of breath, difficulty breathing, or swelling in your legs and feet. Follow-up as needed.

## 2023-07-30 NOTE — ED Notes (Signed)
Soaked patients left index finger in Hibiclens and warm water

## 2023-08-20 DIAGNOSIS — Z1331 Encounter for screening for depression: Secondary | ICD-10-CM | POA: Diagnosis not present

## 2023-08-20 DIAGNOSIS — E782 Mixed hyperlipidemia: Secondary | ICD-10-CM | POA: Diagnosis not present

## 2023-08-20 DIAGNOSIS — E7849 Other hyperlipidemia: Secondary | ICD-10-CM | POA: Diagnosis not present

## 2023-08-20 DIAGNOSIS — Z0001 Encounter for general adult medical examination with abnormal findings: Secondary | ICD-10-CM | POA: Diagnosis not present

## 2023-08-20 DIAGNOSIS — I10 Essential (primary) hypertension: Secondary | ICD-10-CM | POA: Diagnosis not present

## 2023-08-20 DIAGNOSIS — G894 Chronic pain syndrome: Secondary | ICD-10-CM | POA: Diagnosis not present

## 2023-08-20 DIAGNOSIS — R7309 Other abnormal glucose: Secondary | ICD-10-CM | POA: Diagnosis not present

## 2023-08-20 DIAGNOSIS — Z6823 Body mass index (BMI) 23.0-23.9, adult: Secondary | ICD-10-CM | POA: Diagnosis not present

## 2023-09-22 DIAGNOSIS — G894 Chronic pain syndrome: Secondary | ICD-10-CM | POA: Diagnosis not present

## 2023-09-22 DIAGNOSIS — E782 Mixed hyperlipidemia: Secondary | ICD-10-CM | POA: Diagnosis not present

## 2023-09-22 DIAGNOSIS — I10 Essential (primary) hypertension: Secondary | ICD-10-CM | POA: Diagnosis not present

## 2023-09-22 DIAGNOSIS — Z6823 Body mass index (BMI) 23.0-23.9, adult: Secondary | ICD-10-CM | POA: Diagnosis not present

## 2023-09-22 DIAGNOSIS — F419 Anxiety disorder, unspecified: Secondary | ICD-10-CM | POA: Diagnosis not present

## 2023-09-22 DIAGNOSIS — E0590 Thyrotoxicosis, unspecified without thyrotoxic crisis or storm: Secondary | ICD-10-CM | POA: Diagnosis not present

## 2023-10-22 DIAGNOSIS — F419 Anxiety disorder, unspecified: Secondary | ICD-10-CM | POA: Diagnosis not present

## 2023-10-22 DIAGNOSIS — Z6823 Body mass index (BMI) 23.0-23.9, adult: Secondary | ICD-10-CM | POA: Diagnosis not present

## 2023-10-22 DIAGNOSIS — K625 Hemorrhage of anus and rectum: Secondary | ICD-10-CM | POA: Diagnosis not present

## 2023-10-22 DIAGNOSIS — G894 Chronic pain syndrome: Secondary | ICD-10-CM | POA: Diagnosis not present

## 2023-10-30 ENCOUNTER — Other Ambulatory Visit: Payer: Self-pay | Admitting: *Deleted

## 2023-10-30 ENCOUNTER — Ambulatory Visit: Payer: Medicare HMO | Admitting: Gastroenterology

## 2023-10-30 ENCOUNTER — Encounter: Payer: Self-pay | Admitting: *Deleted

## 2023-10-30 ENCOUNTER — Encounter: Payer: Self-pay | Admitting: Gastroenterology

## 2023-10-30 VITALS — BP 178/89 | HR 71 | Temp 98.1°F | Ht 68.0 in | Wt 154.8 lb

## 2023-10-30 DIAGNOSIS — K5903 Drug induced constipation: Secondary | ICD-10-CM

## 2023-10-30 DIAGNOSIS — K5900 Constipation, unspecified: Secondary | ICD-10-CM

## 2023-10-30 DIAGNOSIS — Z1211 Encounter for screening for malignant neoplasm of colon: Secondary | ICD-10-CM

## 2023-10-30 MED ORDER — PEG 3350-KCL-NA BICARB-NACL 420 G PO SOLR: 4000.0000 mL | Freq: Once | ORAL | 0 refills | Status: AC

## 2023-10-30 MED ORDER — LUBIPROSTONE 24 MCG PO CAPS: 24.0000 ug | ORAL_CAPSULE | Freq: Two times a day (BID) | ORAL | 3 refills | Status: DC

## 2023-10-30 NOTE — Progress Notes (Signed)
GI Office Note    Referring Provider: Assunta Found, MD Primary Care Physician:  Assunta Found, MD  Primary Gastroenterologist: Roetta Sessions, MD   Chief Complaint   Chief Complaint  Patient presents with   Constipation    Was having problems with constipations. PCP told him to take MiraLax now stools are runny.      History of Present Illness   Alexander Petty is a 73 y.o. male presenting today for rectal bleeding.  Patient was last seen in 2020.  Attempted to schedule for colonoscopy at that time but required being rescheduled due to COVID restrictions at the time.  Patient was lost to follow-up.  Presents today to schedule colonoscopy. He states he had two in his life but has been a long time ago. No records viewable.  He states constipation got worse before christmas, actually had to go to ER for disimpaction. He believes it was a combination of his pain medication and not able to eat well after getting new dentures. He was started on Miralax. He is taking about a capful. Moving his stools, small amount 2-3 times daily. He has had flare of hemorrhoids recently, notes very small amount of blood on tissue with straining. This resolved. He denies heartburn, vomiting, abdominal pain, melena, weight loss.      Labs from August 20, 2024: Blood cell count 7900, hemoglobin 12.1 low, hematocrit 38.3 normal, MCV 88, platelets 221.  Labs from December 2024: Glucose 127, creatinine 0.97, albumin 4.1, total bilirubin less than 0.2, alk phos 54, AST 16, ALT 13, TSH 0.183   Medications   Current Outpatient Medications  Medication Sig Dispense Refill   Ascorbic Acid (VITAMIN C) 1000 MG tablet Take 1,000 mg by mouth daily.     aspirin EC 81 MG tablet Take 81 mg by mouth daily.     HYDROcodone-acetaminophen (NORCO) 10-325 MG tablet Take 1 tablet by mouth 4 (four) times daily.     ibuprofen (ADVIL,MOTRIN) 800 MG tablet Take 800 mg by mouth every 8 (eight) hours as needed for moderate  pain.             olmesartan (BENICAR) 40 MG tablet Take 40 mg by mouth daily.     polyethylene glycol powder (GLYCOLAX/MIRALAX) 17 GM/SCOOP powder Take 17 g by mouth once.     propranolol (INDERAL) 20 MG tablet Take 20 mg by mouth 2 (two) times daily.     rosuvastatin (CRESTOR) 10 MG tablet Take 10 mg by mouth at bedtime.     No current facility-administered medications for this visit.    Allergies   Allergies as of 10/30/2023   (No Known Allergies)    Past Medical History   Past Medical History:  Diagnosis Date   Hyperlipidemia    Hypertension     Past Surgical History   Past Surgical History:  Procedure Laterality Date   INGUINAL HERNIA REPAIR     bilateral as a child   KNEE SURGERY Bilateral    ROTATOR CUFF REPAIR     UMBILICAL HERNIA REPAIR      Past Family History   Family History  Problem Relation Age of Onset   Colon polyps Neg Hx    Colon cancer Neg Hx     Past Social History   Social History   Socioeconomic History   Marital status: Divorced    Spouse name: Not on file   Number of children: Not on file   Years of education: Not on  file   Highest education level: Not on file  Occupational History   Not on file  Tobacco Use   Smoking status: Never   Smokeless tobacco: Never  Substance and Sexual Activity   Alcohol use: No   Drug use: No   Sexual activity: Not on file  Other Topics Concern   Not on file  Social History Narrative   Not on file   Social Drivers of Health   Financial Resource Strain: Not on file  Food Insecurity: Not on file  Transportation Needs: Not on file  Physical Activity: Not on file  Stress: Not on file  Social Connections: Not on file  Intimate Partner Violence: Not on file    Review of Systems   General: Negative for anorexia, weight loss, fever, chills, fatigue, weakness. Eyes: Negative for vision changes.  ENT: Negative for hoarseness, difficulty swallowing , nasal congestion. CV: Negative for chest  pain, angina, palpitations, dyspnea on exertion, peripheral edema.  Respiratory: Negative for dyspnea at rest, dyspnea on exertion, cough, sputum, wheezing.  GI: See history of present illness. GU:  Negative for dysuria, hematuria, urinary incontinence, urinary frequency, nocturnal urination.  MS: chronic for joint pain, low back pain.  Derm: Negative for rash or itching.  Neuro: Negative for weakness, abnormal sensation, seizure, frequent headaches, memory loss,  confusion.  Psych: Negative for anxiety, depression, suicidal ideation, hallucinations.  Endo: Negative for unusual weight change.  Heme: Negative for bruising or bleeding. Allergy: Negative for rash or hives.  Physical Exam   BP (!) 178/89 (BP Location: Left Arm, Patient Position: Sitting, Cuff Size: Large)   Pulse 71   Temp 98.1 F (36.7 C) (Temporal)   Ht 5\' 8"  (1.727 m)   Wt 154 lb 12.8 oz (70.2 kg)   BMI 23.54 kg/m    General: Well-nourished, well-developed in no acute distress.  Head: Normocephalic, atraumatic.   Eyes: Conjunctiva pink, no icterus. Mouth: Oropharyngeal mucosa moist and pink  Neck: Supple without thyromegaly, masses, or lymphadenopathy.  Lungs: Clear to auscultation bilaterally.  Heart: Regular rate and rhythm, no murmurs rubs or gallops.  Abdomen: Bowel sounds are normal, nontender, nondistended, no hepatosplenomegaly or masses,  no abdominal bruits or hernia, no rebound or guarding.   Rectal: not performed Extremities: No lower extremity edema. No clubbing or deformities.  Neuro: Alert and oriented x 4 , grossly normal neurologically.  Skin: Warm and dry, no rash or jaundice.   Psych: Alert and cooperative, normal mood and affect.  Labs   See hpi  Imaging Studies   No results found.  Assessment/Plan:   Constipation: -some improvement on miralax but does not feel productive. He can try increasing to BID or switch to lubiprostone BID. He would like RX and will try using  lubiprostone but if needed desires option of adding back miralax.  Screening colonoscopy: -colonoscopy in near future. ASA 2.  I have discussed the risks, alternatives, benefits with regards to but not limited to the risk of reaction to medication, bleeding, infection, perforation and the patient is agreeable to proceed. Written consent to be obtained.  The patient was found to have elevated blood pressure when vital signs were checked in the office. The blood pressure was rechecked by the nursing staff and it was found be persistently elevated >140/90 mmHg. I personally advised to the patient to follow up closely with his PCP for hypertension control. Reviewed BP at PCP office, BP with systolic in the 130 range.   Leanna Battles. Melvyn Neth, MHS,  PA-C [facility]

## 2023-10-30 NOTE — Patient Instructions (Addendum)
You can continue miralax (polyethylene glycol) one capful once to twice daily for constipation. I have sent in lubiprostone one capsule twice daily with food for constipation. You can use both above if needed but you may find that you only need lubiprostone. Colonoscopy to be scheduled. See separate instructions.

## 2023-10-30 NOTE — H&P (View-Only) (Signed)
 GI Office Note    Referring Provider: Assunta Found, MD Primary Care Physician:  Assunta Found, MD  Primary Gastroenterologist: Roetta Sessions, MD   Chief Complaint   Chief Complaint  Patient presents with   Constipation    Was having problems with constipations. PCP told him to take MiraLax now stools are runny.      History of Present Illness   Alexander Petty is a 73 y.o. male presenting today for rectal bleeding.  Patient was last seen in 2020.  Attempted to schedule for colonoscopy at that time but required being rescheduled due to COVID restrictions at the time.  Patient was lost to follow-up.  Presents today to schedule colonoscopy. He states he had two in his life but has been a long time ago. No records viewable.  He states constipation got worse before christmas, actually had to go to ER for disimpaction. He believes it was a combination of his pain medication and not able to eat well after getting new dentures. He was started on Miralax. He is taking about a capful. Moving his stools, small amount 2-3 times daily. He has had flare of hemorrhoids recently, notes very small amount of blood on tissue with straining. This resolved. He denies heartburn, vomiting, abdominal pain, melena, weight loss.      Labs from August 20, 2024: Blood cell count 7900, hemoglobin 12.1 low, hematocrit 38.3 normal, MCV 88, platelets 221.  Labs from December 2024: Glucose 127, creatinine 0.97, albumin 4.1, total bilirubin less than 0.2, alk phos 54, AST 16, ALT 13, TSH 0.183   Medications   Current Outpatient Medications  Medication Sig Dispense Refill   Ascorbic Acid (VITAMIN C) 1000 MG tablet Take 1,000 mg by mouth daily.     aspirin EC 81 MG tablet Take 81 mg by mouth daily.     HYDROcodone-acetaminophen (NORCO) 10-325 MG tablet Take 1 tablet by mouth 4 (four) times daily.     ibuprofen (ADVIL,MOTRIN) 800 MG tablet Take 800 mg by mouth every 8 (eight) hours as needed for moderate  pain.             olmesartan (BENICAR) 40 MG tablet Take 40 mg by mouth daily.     polyethylene glycol powder (GLYCOLAX/MIRALAX) 17 GM/SCOOP powder Take 17 g by mouth once.     propranolol (INDERAL) 20 MG tablet Take 20 mg by mouth 2 (two) times daily.     rosuvastatin (CRESTOR) 10 MG tablet Take 10 mg by mouth at bedtime.     No current facility-administered medications for this visit.    Allergies   Allergies as of 10/30/2023   (No Known Allergies)    Past Medical History   Past Medical History:  Diagnosis Date   Hyperlipidemia    Hypertension     Past Surgical History   Past Surgical History:  Procedure Laterality Date   INGUINAL HERNIA REPAIR     bilateral as a child   KNEE SURGERY Bilateral    ROTATOR CUFF REPAIR     UMBILICAL HERNIA REPAIR      Past Family History   Family History  Problem Relation Age of Onset   Colon polyps Neg Hx    Colon cancer Neg Hx     Past Social History   Social History   Socioeconomic History   Marital status: Divorced    Spouse name: Not on file   Number of children: Not on file   Years of education: Not on  file   Highest education level: Not on file  Occupational History   Not on file  Tobacco Use   Smoking status: Never   Smokeless tobacco: Never  Substance and Sexual Activity   Alcohol use: No   Drug use: No   Sexual activity: Not on file  Other Topics Concern   Not on file  Social History Narrative   Not on file   Social Drivers of Health   Financial Resource Strain: Not on file  Food Insecurity: Not on file  Transportation Needs: Not on file  Physical Activity: Not on file  Stress: Not on file  Social Connections: Not on file  Intimate Partner Violence: Not on file    Review of Systems   General: Negative for anorexia, weight loss, fever, chills, fatigue, weakness. Eyes: Negative for vision changes.  ENT: Negative for hoarseness, difficulty swallowing , nasal congestion. CV: Negative for chest  pain, angina, palpitations, dyspnea on exertion, peripheral edema.  Respiratory: Negative for dyspnea at rest, dyspnea on exertion, cough, sputum, wheezing.  GI: See history of present illness. GU:  Negative for dysuria, hematuria, urinary incontinence, urinary frequency, nocturnal urination.  MS: chronic for joint pain, low back pain.  Derm: Negative for rash or itching.  Neuro: Negative for weakness, abnormal sensation, seizure, frequent headaches, memory loss,  confusion.  Psych: Negative for anxiety, depression, suicidal ideation, hallucinations.  Endo: Negative for unusual weight change.  Heme: Negative for bruising or bleeding. Allergy: Negative for rash or hives.  Physical Exam   BP (!) 178/89 (BP Location: Left Arm, Patient Position: Sitting, Cuff Size: Large)   Pulse 71   Temp 98.1 F (36.7 C) (Temporal)   Ht 5\' 8"  (1.727 m)   Wt 154 lb 12.8 oz (70.2 kg)   BMI 23.54 kg/m    General: Well-nourished, well-developed in no acute distress.  Head: Normocephalic, atraumatic.   Eyes: Conjunctiva pink, no icterus. Mouth: Oropharyngeal mucosa moist and pink  Neck: Supple without thyromegaly, masses, or lymphadenopathy.  Lungs: Clear to auscultation bilaterally.  Heart: Regular rate and rhythm, no murmurs rubs or gallops.  Abdomen: Bowel sounds are normal, nontender, nondistended, no hepatosplenomegaly or masses,  no abdominal bruits or hernia, no rebound or guarding.   Rectal: not performed Extremities: No lower extremity edema. No clubbing or deformities.  Neuro: Alert and oriented x 4 , grossly normal neurologically.  Skin: Warm and dry, no rash or jaundice.   Psych: Alert and cooperative, normal mood and affect.  Labs   See hpi  Imaging Studies   No results found.  Assessment/Plan:   Constipation: -some improvement on miralax but does not feel productive. He can try increasing to BID or switch to lubiprostone BID. He would like RX and will try using  lubiprostone but if needed desires option of adding back miralax.  Screening colonoscopy: -colonoscopy in near future. ASA 2.  I have discussed the risks, alternatives, benefits with regards to but not limited to the risk of reaction to medication, bleeding, infection, perforation and the patient is agreeable to proceed. Written consent to be obtained.  The patient was found to have elevated blood pressure when vital signs were checked in the office. The blood pressure was rechecked by the nursing staff and it was found be persistently elevated >140/90 mmHg. I personally advised to the patient to follow up closely with his PCP for hypertension control. Reviewed BP at PCP office, BP with systolic in the 130 range.   Leanna Battles. Melvyn Neth, MHS,  PA-C [facility]

## 2023-11-02 ENCOUNTER — Telehealth: Payer: Self-pay

## 2023-11-02 NOTE — Telephone Encounter (Signed)
 Patient must try and fail Linzess before his insurance will pay for lubiprostone.

## 2023-11-04 ENCOUNTER — Encounter: Payer: Self-pay | Admitting: Gastroenterology

## 2023-11-05 MED ORDER — LINACLOTIDE 145 MCG PO CAPS: 145.0000 ug | ORAL_CAPSULE | Freq: Every day | ORAL | 11 refills | Status: DC

## 2023-11-05 NOTE — Addendum Note (Signed)
 Addended by: Tiffany Kocher on: 11/05/2023 05:51 AM   Modules accepted: Orders

## 2023-11-05 NOTE — Telephone Encounter (Signed)
 Linzess daily sent.

## 2023-11-11 DIAGNOSIS — G894 Chronic pain syndrome: Secondary | ICD-10-CM | POA: Diagnosis not present

## 2023-11-23 ENCOUNTER — Other Ambulatory Visit: Payer: Self-pay

## 2023-11-23 ENCOUNTER — Ambulatory Visit (HOSPITAL_COMMUNITY): Admitting: Anesthesiology

## 2023-11-23 ENCOUNTER — Encounter (HOSPITAL_COMMUNITY): Payer: Self-pay | Admitting: Internal Medicine

## 2023-11-23 ENCOUNTER — Ambulatory Visit (HOSPITAL_COMMUNITY)
Admission: RE | Admit: 2023-11-23 | Discharge: 2023-11-23 | Disposition: A | Discharge status: Home / Self Care | Payer: Medicare HMO | Attending: Internal Medicine | Admitting: Internal Medicine

## 2023-11-23 ENCOUNTER — Encounter (HOSPITAL_COMMUNITY)
Admission: RE | Disposition: A | Discharge status: Home / Self Care | Payer: Self-pay | Source: Home / Self Care | Attending: Internal Medicine

## 2023-11-23 DIAGNOSIS — I10 Essential (primary) hypertension: Secondary | ICD-10-CM | POA: Diagnosis not present

## 2023-11-23 DIAGNOSIS — K5730 Diverticulosis of large intestine without perforation or abscess without bleeding: Secondary | ICD-10-CM | POA: Insufficient documentation

## 2023-11-23 DIAGNOSIS — Z139 Encounter for screening, unspecified: Secondary | ICD-10-CM | POA: Diagnosis not present

## 2023-11-23 DIAGNOSIS — Z1211 Encounter for screening for malignant neoplasm of colon: Secondary | ICD-10-CM | POA: Diagnosis not present

## 2023-11-23 DIAGNOSIS — K6289 Other specified diseases of anus and rectum: Secondary | ICD-10-CM | POA: Diagnosis not present

## 2023-11-23 DIAGNOSIS — K626 Ulcer of anus and rectum: Secondary | ICD-10-CM

## 2023-11-23 HISTORY — PX: COLONOSCOPY WITH PROPOFOL: SHX5780

## 2023-11-23 SURGERY — COLONOSCOPY WITH PROPOFOL
Anesthesia: General

## 2023-11-23 MED ORDER — PROPOFOL 500 MG/50ML IV EMUL
INTRAVENOUS | Status: DC | PRN
  Administered 2023-11-23: 125 ug/kg/min via INTRAVENOUS

## 2023-11-23 MED ORDER — LIDOCAINE HCL (PF) 2 % IJ SOLN
INTRAMUSCULAR | Status: AC
  Filled 2023-11-23: qty 5

## 2023-11-23 MED ORDER — LIDOCAINE HCL (CARDIAC) PF 100 MG/5ML IV SOSY
PREFILLED_SYRINGE | INTRAVENOUS | Status: DC | PRN
  Administered 2023-11-23: 60 mg via INTRATRACHEAL

## 2023-11-23 MED ORDER — PHENYLEPHRINE 80 MCG/ML (10ML) SYRINGE FOR IV PUSH (FOR BLOOD PRESSURE SUPPORT)
PREFILLED_SYRINGE | INTRAVENOUS | Status: DC | PRN
  Administered 2023-11-23: 160 ug via INTRAVENOUS

## 2023-11-23 MED ORDER — PROPOFOL 1000 MG/100ML IV EMUL
INTRAVENOUS | Status: AC
  Filled 2023-11-23: qty 100

## 2023-11-23 MED ORDER — LACTATED RINGERS IV SOLN: INTRAVENOUS | Status: DC

## 2023-11-23 MED ORDER — PROPOFOL 10 MG/ML IV BOLUS
INTRAVENOUS | Status: DC | PRN
  Administered 2023-11-23: 80 mg via INTRAVENOUS

## 2023-11-23 NOTE — Discharge Instructions (Signed)
  Colonoscopy Discharge Instructions  Read the instructions outlined below and refer to this sheet in the next few weeks. These discharge instructions provide you with general information on caring for yourself after you leave the hospital. Your doctor may also give you specific instructions. While your treatment has been planned according to the most current medical practices available, unavoidable complications occasionally occur. If you have any problems or questions after discharge, call Dr. Jena Gauss at [phone]. ACTIVITY You may resume your regular activity, but move at a slower pace for the next 24 hours.  Take frequent rest periods for the next 24 hours.  Walking will help get rid of the air and reduce the bloated feeling in your belly (abdomen).  No driving for 24 hours (because of the medicine (anesthesia) used during the test).   Do not sign any important legal documents or operate any machinery for 24 hours (because of the anesthesia used during the test).  NUTRITION Drink plenty of fluids.  You may resume your normal diet as instructed by your doctor.  Begin with a light meal and progress to your normal diet. Heavy or fried foods are harder to digest and may make you feel sick to your stomach (nauseated).  Avoid alcoholic beverages for 24 hours or as instructed.  MEDICATIONS You may resume your normal medications unless your doctor tells you otherwise.  WHAT YOU CAN EXPECT TODAY Some feelings of bloating in the abdomen.  Passage of more gas than usual.  Spotting of blood in your stool or on the toilet paper.  IF YOU HAD POLYPS REMOVED DURING THE COLONOSCOPY: No aspirin products for 7 days or as instructed.  No alcohol for 7 days or as instructed.  Eat a soft diet for the next 24 hours.  FINDING OUT THE RESULTS OF YOUR TEST Not all test results are available during your visit. If your test results are not back during the visit, make an appointment with your caregiver to find out the  results. Do not assume everything is normal if you have not heard from your caregiver or the medical facility. It is important for you to follow up on all of your test results.  SEEK IMMEDIATE MEDICAL ATTENTION IF: You have more than a spotting of blood in your stool.  Your belly is swollen (abdominal distention).  You are nauseated or vomiting.  You have a temperature over 101.  You have abdominal pain or discomfort that is severe or gets worse throughout the day.      your rectum was inflamed.  Biopsies taken.    You have diverticulosis but no polyps   follow-up appointment with Tana Coast in 4 to 6 weeks   at patient request, called Trevin Gartrell brother and [phone] -  reviewed findings and recommendations

## 2023-11-23 NOTE — Anesthesia Preprocedure Evaluation (Signed)

## 2023-11-23 NOTE — Op Note (Signed)
 Alexander Petty Patient Name: Alexander Petty Procedure Date: 11/23/2023 8:47 AM MRN: [id number] Date of Birth: April 22, 1951 Attending MD: Gennette Pac , MD, [id number] CSN: [id number] Age: 73 Admit Type: Outpatient Procedure:                Colonoscopy Indications:              Screening for colorectal malignant neoplasm Providers:                Gennette Pac, MD, Angelica Ran, Elinor Parkinson Referring MD:              Medicines:                Propofol per Anesthesia Complications:            No immediate complications. Estimated Blood Loss:     Estimated blood loss was minimal. Procedure:                Pre-Anesthesia Assessment:                           - Prior to the procedure, a History and Physical                            was performed, and patient medications and                            allergies were reviewed. The patient's tolerance of                            previous anesthesia was also reviewed. The risks                            and benefits of the procedure and the sedation                            options and risks were discussed with the patient.                            All questions were answered, and informed consent                            was obtained. Prior Anticoagulants: The patient has                            taken no anticoagulant or antiplatelet agents. ASA                            Grade Assessment: II - A patient with mild systemic                            disease. After reviewing the risks and benefits,  the patient was deemed in satisfactory condition to                            undergo the procedure.                           After obtaining informed consent, the colonoscope                            was passed under direct vision. Throughout the                            procedure, the patient's blood pressure, pulse, and                            oxygen  saturations were monitored continuously. The                            [id number]) scope was introduced through the                            anus and advanced to the the cecum, identified by                            appendiceal orifice and ileocecal valve. The                            colonoscopy was performed without difficulty. The                            patient tolerated the procedure well. The quality                            of the bowel preparation was adequate. The                            ileocecal valve, appendiceal orifice, and rectum                            were photographed. The entire colon was well                            visualized. The patient tolerated the procedure                            well. The quality of the bowel preparation was                            adequate. Scope In: 9:10:03 AM Scope Out: 9:25:37 AM Scope Withdrawal Time: 0 hours 9 minutes 28 seconds  Total Procedure Duration: 0 hours 15 minutes 34 seconds  Findings:      The perianal and digital rectal examinations were normal.      Scattered medium-mouthed diverticula were found throughout the colon.       Distal  5 cm of rectal mucosa circumferentially inflamed with geographic       ulcerations and erythema. Please see photos. Biopsies taken. Otherwise,       retroflexion in demonstrated no abnormalities Impression:               - Diverticulosis in the cecum. Distal rectum                            inflamed. Biopsies taken.                           - Moderate Sedation:      Moderate (conscious) sedation was personally administered by an       anesthesia professional. The following parameters were monitored: oxygen       saturation, heart rate, blood pressure, respiratory rate, EKG, adequacy       of pulmonary ventilation, and response to care. Recommendation:           - Patient has a contact number available for                            emergencies. The signs and  symptoms of potential                            delayed complications were discussed with the                            patient. Return to normal activities tomorrow.                            Written discharge instructions were provided to the                            patient.                           - Advance diet as tolerated. Follow-up pathology.                            No future colonoscopy recommended for screening                            purposes. Office visit with [place] in 4 to 6 weeks Procedure Code(s):        --- Professional ---                           [phone], Colonoscopy, flexible; diagnostic, including                            collection of specimen(s) by brushing or washing,                            when performed (separate procedure) Diagnosis Code(s):        --- Professional ---  Z12.11, Encounter for screening for malignant                            neoplasm of colon                           K57.30, Diverticulosis of large intestine without                            perforation or abscess without bleeding CPT copyright 2022 American Medical Association. All rights reserved. The codes documented in this report are preliminary and upon coder review may  be revised to meet current compliance requirements. Alexander Petty. Alexander Turck, MD Gennette Pac, MD 11/23/2023 9:39:34 AM This report has been signed electronically. Number of Addenda: 0

## 2023-11-23 NOTE — Transfer of Care (Addendum)
 Immediate Anesthesia Transfer of Care Note  Patient: Alexander Petty  Procedure(s) Performed: COLONOSCOPY WITH PROPOFOL  Patient Location: Endoscopy Unit  Anesthesia Type:General  Level of Consciousness: drowsy and patient cooperative  Airway & Oxygen Therapy: Patient Spontanous Breathing and Patient connected to nasal cannula oxygen  Post-op Assessment: Report given to RN and Post -op Vital signs reviewed and stable  Post vital signs: Reviewed and stable  Last Vitals:  Vitals Value Taken Time  BP 100/43 11/23/23   0930  Temp 36.4 11/23/23   0930  Pulse 67 11/23/23   0930  Resp 19 11/23/23   0930  SpO2 98% 11/23/23   0930    Last Pain:  Vitals:   11/23/23 0904  TempSrc:   PainSc: 0-No pain      Patients Stated Pain Goal: 4 (11/23/23 0805)  Complications: No notable events documented.

## 2023-11-23 NOTE — Interval H&P Note (Signed)
 History and Physical Interval Note:  11/23/2023 8:56 AM  Alexander Petty  has presented today for surgery, with the diagnosis of SCREENING.  The various methods of treatment have been discussed with the patient and family. After consideration of risks, benefits and other options for treatment, the patient has consented to  Procedure(s) with comments: COLONOSCOPY WITH PROPOFOL (N/A) - 2:00 PM,  ASA 2 as a surgical intervention.  The patient's history has been reviewed, patient examined, no change in status, stable for surgery.  I have reviewed the patient's chart and labs.  Questions were answered to the patient's satisfaction.     [name]   no change.  Patient here for screening colonoscopy.  The risks, benefits, limitations, alternatives and imponderables have been reviewed with the patient. Questions have been answered. All parties are agreeable.

## 2023-11-23 NOTE — Anesthesia Postprocedure Evaluation (Signed)
 Anesthesia Post Note  Patient: Alexander Petty  Procedure(s) Performed: COLONOSCOPY WITH PROPOFOL  Patient location during evaluation: Phase II Anesthesia Type: General Level of consciousness: awake Pain management: pain level controlled Vital Signs Assessment: post-procedure vital signs reviewed and stable Respiratory status: spontaneous breathing and respiratory function stable Cardiovascular status: blood pressure returned to baseline and stable Postop Assessment: no headache and no apparent nausea or vomiting Anesthetic complications: no Comments: Late entry   No notable events documented.   Last Vitals:  Vitals:   11/23/23 0932 11/23/23 0935  BP: 104/62 127/75  Pulse: 67   Resp:    Temp:    SpO2: 100%     Last Pain:  Vitals:   11/23/23 0930  TempSrc: Oral  PainSc: 0-No pain                 [name]

## 2023-11-24 ENCOUNTER — Other Ambulatory Visit: Payer: Self-pay

## 2023-11-24 ENCOUNTER — Encounter: Payer: Self-pay | Admitting: Internal Medicine

## 2023-11-24 ENCOUNTER — Encounter (HOSPITAL_COMMUNITY): Payer: Self-pay | Admitting: Internal Medicine

## 2023-11-24 LAB — SURGICAL PATHOLOGY

## 2023-11-24 MED ORDER — MESALAMINE 1000 MG RE SUPP: 1000.0000 mg | Freq: Every day | RECTAL | 11 refills | Status: AC

## 2023-12-09 DIAGNOSIS — G894 Chronic pain syndrome: Secondary | ICD-10-CM | POA: Diagnosis not present

## 2024-01-04 ENCOUNTER — Ambulatory Visit (INDEPENDENT_AMBULATORY_CARE_PROVIDER_SITE_OTHER): Admitting: Gastroenterology

## 2024-01-04 ENCOUNTER — Encounter: Payer: Self-pay | Admitting: Gastroenterology

## 2024-01-04 VITALS — BP 173/93 | HR 54 | Temp 98.4°F | Ht 67.0 in | Wt 154.0 lb

## 2024-01-04 DIAGNOSIS — K6289 Other specified diseases of anus and rectum: Secondary | ICD-10-CM | POA: Insufficient documentation

## 2024-01-04 DIAGNOSIS — K5900 Constipation, unspecified: Secondary | ICD-10-CM | POA: Diagnosis not present

## 2024-01-04 NOTE — Patient Instructions (Signed)
  Continue miralax for constipation as long as you need it. It is important to avoid straining.   Please monitor for any recurrent rectal bleeding.   You can follow up with your PCP. If you have any recurrent or new symptoms, feel free to reach out to us !

## 2024-01-04 NOTE — Progress Notes (Signed)
 GI Office Note    Referring Provider: Minus Amel, MD Primary Care Physician:  Minus Amel, MD  Primary Gastroenterologist: Rheba Cedar, MD   Chief Complaint   Chief Complaint  Patient presents with   Follow-up    Doing well, no issues    History of Present Illness   Alexander Petty is a 73 y.o. male presenting today for follow up. Last seen 10/2023.   Seen for constipation, rectal bleeding likely exacerbated by pain medication and change in diet in setting of getting new dentures. Completed colonoscopy. Nonspecific proctitis noted. Possibly due to prolapse in setting of constipation. Treated with canasa  suppositories and miralax.  Doing well. Taking miralax daily. Stools are soft and regular. No further bleeding. He stopped canasa  after 2 weeks given resolution of symptoms. No abdominal pain. No UGI symptoms.    Colonoscopy 11/2023: -diverticulosis -nonspecific proctitis, suspected prolapse in setting of constipation   Medications   Current Outpatient Medications  Medication Sig Dispense Refill   Ascorbic Acid (VITAMIN C) 1000 MG tablet Take 1,000 mg by mouth daily.     aspirin EC 81 MG tablet Take 81 mg by mouth daily.     HYDROcodone-acetaminophen (NORCO) 10-325 MG tablet Take 1 tablet by mouth 4 (four) times daily.     ibuprofen (ADVIL,MOTRIN) 800 MG tablet Take 800 mg by mouth every 8 (eight) hours as needed for moderate pain.      mesalamine  (CANASA ) 1000 MG suppository Place 1 suppository (1,000 mg total) rectally at bedtime. 30 suppository 11   olmesartan (BENICAR) 40 MG tablet Take 40 mg by mouth daily.     polyethylene glycol powder (GLYCOLAX/MIRALAX) 17 GM/SCOOP powder Take 17 g by mouth once.     propranolol (INDERAL) 20 MG tablet Take 20 mg by mouth 2 (two) times daily.     rosuvastatin (CRESTOR) 10 MG tablet Take 10 mg by mouth at bedtime.     No current facility-administered medications for this visit.    Allergies   Allergies as of  01/04/2024   (No Known Allergies)      Review of Systems   General: Negative for anorexia, weight loss, fever, chills, fatigue, weakness. ENT: Negative for hoarseness, difficulty swallowing , nasal congestion. CV: Negative for chest pain, angina, palpitations, dyspnea on exertion, peripheral edema.  Respiratory: Negative for dyspnea at rest, dyspnea on exertion, cough, sputum, wheezing.  GI: See history of present illness. GU:  Negative for dysuria, hematuria, urinary incontinence, urinary frequency, nocturnal urination.  Endo: Negative for unusual weight change.     Physical Exam   BP (!) 173/93 (BP Location: Right Arm, Patient Position: Sitting, Cuff Size: Normal)   Pulse (!) 54   Temp 98.4 F (36.9 C) (Oral)   Ht 5\' 7"  (1.702 m)   Wt 154 lb (69.9 kg)   SpO2 98%   BMI 24.12 kg/m    General: Well-nourished, well-developed in no acute distress.  Eyes: No icterus. Mouth: Oropharyngeal mucosa moist and pink  Extremities: No lower extremity edema. No clubbing or deformities. Neuro: Alert and oriented x 4   Skin: Warm and dry, no jaundice.   Psych: Alert and cooperative, normal mood and affect.  Labs   None  Imaging Studies   No results found.  Assessment/Plan:   Rectal bleeding/constipation: improved -nonspecific proctitis noted on colonoscopy/biopsy suspected prolapse in setting of constipation -doing better -continue miralax once daily for constipation. -he has discontinued canasa , monitor for recurrent symptoms -unless bleeding persists, or new symptoms  arise, he can follow with his PCP, otherwise he should reach out for return office visit.  Trudie Fuse. Harles Lied, MHS, PA-C [facility]

## 2024-01-07 DIAGNOSIS — I10 Essential (primary) hypertension: Secondary | ICD-10-CM | POA: Diagnosis not present

## 2024-01-07 DIAGNOSIS — G894 Chronic pain syndrome: Secondary | ICD-10-CM | POA: Diagnosis not present

## 2024-01-07 DIAGNOSIS — Z6823 Body mass index (BMI) 23.0-23.9, adult: Secondary | ICD-10-CM | POA: Diagnosis not present

## 2024-01-29 ENCOUNTER — Ambulatory Visit
Admission: EM | Admit: 2024-01-29 | Discharge: 2024-01-29 | Disposition: A | Discharge status: Home / Self Care | Attending: Family Medicine | Admitting: Family Medicine

## 2024-01-29 DIAGNOSIS — J029 Acute pharyngitis, unspecified: Secondary | ICD-10-CM

## 2024-01-29 DIAGNOSIS — J3489 Other specified disorders of nose and nasal sinuses: Secondary | ICD-10-CM | POA: Diagnosis not present

## 2024-01-29 LAB — POCT RAPID STREP A (OFFICE): Rapid Strep A Screen: NEGATIVE

## 2024-01-29 MED ORDER — AZELASTINE HCL 0.1 % NA SOLN: 1.0000 | Freq: Two times a day (BID) | NASAL | 0 refills | Status: AC

## 2024-01-29 MED ORDER — LIDOCAINE VISCOUS HCL 2 % MT SOLN: 10.0000 mL | OROMUCOSAL | 0 refills | Status: AC | PRN

## 2024-01-29 NOTE — ED Provider Notes (Signed)
 [facility]    CSN: [id number] Arrival date & time: 01/29/24  1130      History   Chief Complaint Chief Complaint  Patient presents with   Sore Throat    HPI Alexander Petty is a 73 y.o. male.   Patient presenting today with 2-day history of sore throat, sinus drainage.  Denies fever, chills, cough, chest pain, shortness of breath, abdominal pain, nausea vomiting or diarrhea.  So far not trying anything over-the-counter for symptoms.  Denies any history of seasonal allergy issues, no sick contacts.  States typically when he gets the symptoms his primary care will prescribe a Z-Pak.    Past Medical History:  Diagnosis Date   Hyperlipidemia    Hypertension     Patient Active Problem List   Diagnosis Date Noted   Proctitis 01/04/2024   Screening for colon cancer 10/30/2023   Constipation 10/30/2023   Encounter for screening colonoscopy 10/28/2018    Past Surgical History:  Procedure Laterality Date   COLONOSCOPY WITH PROPOFOL  N/A 11/23/2023   Procedure: COLONOSCOPY WITH PROPOFOL ;  Surgeon: Suzette Espy, MD;  [facility];  Service: Endoscopy;  Laterality: N/A;  2:00 PM,  ASA 2   INGUINAL HERNIA REPAIR     bilateral as a child   KNEE SURGERY Bilateral    ROTATOR CUFF REPAIR     UMBILICAL HERNIA REPAIR         Home Medications    Prior to Admission medications   Medication Sig Start Date End Date Taking? Authorizing Provider  azelastine (ASTELIN) 0.1 % nasal spray Place 1 spray into both nostrils 2 (two) times daily. Use in each nostril as directed 01/29/24  Yes Corbin Dess, PA-C  lidocaine  (XYLOCAINE ) 2 % solution Use as directed 10 mLs in the mouth or throat every 3 (three) hours as needed. 01/29/24  Yes Corbin Dess, PA-C  Ascorbic Acid (VITAMIN C) 1000 MG tablet Take 1,000 mg by mouth daily.    [provider]  aspirin EC 81 MG tablet Take 81 mg by mouth daily.    [provider]   HYDROcodone-acetaminophen (NORCO) 10-325 MG tablet Take 1 tablet by mouth 4 (four) times daily.    [provider]  ibuprofen (ADVIL,MOTRIN) 800 MG tablet Take 800 mg by mouth every 8 (eight) hours as needed for moderate pain.     [provider]  mesalamine  (CANASA ) 1000 MG suppository Place 1 suppository (1,000 mg total) rectally at bedtime. 11/24/23   Rourk, Windsor Hatcher, MD  olmesartan (BENICAR) 40 MG tablet Take 40 mg by mouth daily. 08/20/23   [provider]  polyethylene glycol powder (GLYCOLAX/MIRALAX) 17 GM/SCOOP powder Take 17 g by mouth once.    [provider]  propranolol (INDERAL) 20 MG tablet Take 20 mg by mouth 2 (two) times daily. 09/22/23   [provider]  rosuvastatin (CRESTOR) 10 MG tablet Take 10 mg by mouth at bedtime. 09/22/23   [provider]    Family History Family History  Problem Relation Age of Onset   Colon polyps Neg Hx    Colon cancer Neg Hx     Social History Social History   Tobacco Use   Smoking status: Never   Smokeless tobacco: Never  Vaping Use   Vaping status: Every Day  Substance Use Topics   Alcohol use: No   Drug use: No     Allergies   Patient has no known allergies.   Review of  Systems Review of Systems Per HPI  Physical Exam Triage Vital Signs ED Triage Vitals  Encounter Vitals Group     BP 01/29/24 1202 (!) 166/84     Systolic BP Percentile --      Diastolic BP Percentile --      Pulse Rate 01/29/24 1202 (!) 59     Resp 01/29/24 1202 16     Temp 01/29/24 1202 98.1 F (36.7 C)     Temp Source 01/29/24 1202 Oral     SpO2 01/29/24 1202 94 %     Weight --      Height --      Head Circumference --      Peak Flow --      Pain Score 01/29/24 1201 5     Pain Loc --      Pain Education --      Exclude from Growth Chart --    No data found.  Updated Vital Signs BP (!) 166/84 (BP Location: Right Arm)   Pulse (!) 59   Temp 98.1 F (36.7 C) (Oral)   Resp 16   SpO2 94%    Visual Acuity Right Eye Distance:   Left Eye Distance:   Bilateral Distance:    Right Eye Near:   Left Eye Near:    Bilateral Near:     Physical Exam Vitals and nursing note reviewed.  Constitutional:      Appearance: He is well-developed.  HENT:     Head: Atraumatic.     Right Ear: External ear normal.     Left Ear: External ear normal.     Nose: Nose normal.     Mouth/Throat:     Mouth: Mucous membranes are moist.     Pharynx: No oropharyngeal exudate.     Comments: Mild oropharyngeal erythema, cobblestoning Eyes:     Conjunctiva/sclera: Conjunctivae normal.     Pupils: Pupils are equal, round, and reactive to light.  Cardiovascular:     Rate and Rhythm: Normal rate and regular rhythm.  Pulmonary:     Effort: Pulmonary effort is normal. No respiratory distress.     Breath sounds: No wheezing or rales.  Musculoskeletal:        General: Normal range of motion.     Cervical back: Normal range of motion and neck supple.  Lymphadenopathy:     Cervical: No cervical adenopathy.  Skin:    General: Skin is warm and dry.  Neurological:     Mental Status: He is alert and oriented to person, place, and time.  Psychiatric:        Behavior: Behavior normal.      UC Treatments / Results  Labs (all labs ordered are listed, but only abnormal results are displayed) Labs Reviewed  POCT RAPID STREP A (OFFICE)    EKG   Radiology No results found.  Procedures Procedures (including critical care time)  Medications Ordered in UC Medications - No data to display  Initial Impression / Assessment and Plan / UC Course  I have reviewed the triage vital signs and the nursing notes.  Pertinent labs & imaging results that were available during my care of the patient were reviewed by me and considered in my medical decision making (see chart for details).     Exam very reassuring today, he is well-appearing and in no acute distress.  Rapid strep negative, no evidence of a  bacterial infection today.  Suspect viral versus allergic symptoms, likely postnasal drainage lending to  sore throat.  Discussed Astelin, viscous lidocaine , supportive over-the-counter medications and home care.  He is requesting a Z-Pak.  Discussed that there is no indication for this medication.  Return for worsening symptoms.  Final Clinical Impressions(s) / UC Diagnoses   Final diagnoses:  Sinus drainage  Sore throat     Discharge Instructions      In addition to the prescribed medications, you may take Coricidin HBP, use salt water rinses, throat lozenges, Tylenol.  Follow-up for worsening symptoms  ED Prescriptions     Medication Sig Dispense Auth. Provider   azelastine (ASTELIN) 0.1 % nasal spray Place 1 spray into both nostrils 2 (two) times daily. Use in each nostril as directed 30 mL Corbin Dess, PA-C   lidocaine  (XYLOCAINE ) 2 % solution Use as directed 10 mLs in the mouth or throat every 3 (three) hours as needed. 100 mL Corbin Dess, [place]      PDMP not reviewed this encounter.   Corbin Dess, [place] 01/29/24 1422

## 2024-01-29 NOTE — ED Triage Notes (Signed)
 Pt states he has throat pain x 2 days

## 2024-01-29 NOTE — Discharge Instructions (Signed)
 In addition to the prescribed medications, you may take Coricidin HBP, use salt water rinses, throat lozenges, Tylenol.  Follow-up for worsening symptoms

## 2024-02-01 DIAGNOSIS — G894 Chronic pain syndrome: Secondary | ICD-10-CM | POA: Diagnosis not present

## 2024-02-22 DIAGNOSIS — G894 Chronic pain syndrome: Secondary | ICD-10-CM | POA: Diagnosis not present

## 2024-03-17 DIAGNOSIS — E782 Mixed hyperlipidemia: Secondary | ICD-10-CM | POA: Diagnosis not present

## 2024-03-17 DIAGNOSIS — E0590 Thyrotoxicosis, unspecified without thyrotoxic crisis or storm: Secondary | ICD-10-CM | POA: Diagnosis not present

## 2024-03-17 DIAGNOSIS — I10 Essential (primary) hypertension: Secondary | ICD-10-CM | POA: Diagnosis not present

## 2024-03-17 DIAGNOSIS — G894 Chronic pain syndrome: Secondary | ICD-10-CM | POA: Diagnosis not present

## 2024-03-17 DIAGNOSIS — Z6821 Body mass index (BMI) 21.0-21.9, adult: Secondary | ICD-10-CM | POA: Diagnosis not present

## 2024-03-17 DIAGNOSIS — Z0001 Encounter for general adult medical examination with abnormal findings: Secondary | ICD-10-CM | POA: Diagnosis not present

## 2024-04-06 DIAGNOSIS — G894 Chronic pain syndrome: Secondary | ICD-10-CM | POA: Diagnosis not present

## 2024-04-27 DIAGNOSIS — G894 Chronic pain syndrome: Secondary | ICD-10-CM | POA: Diagnosis not present

## 2024-05-06 ENCOUNTER — Encounter: Payer: Self-pay | Admitting: "Endocrinology

## 2024-05-06 ENCOUNTER — Ambulatory Visit: Payer: No Typology Code available for payment source | Admitting: "Endocrinology

## 2024-05-06 VITALS — BP 128/84 | HR 56 | Ht 67.0 in | Wt 145.0 lb

## 2024-05-06 DIAGNOSIS — R7989 Other specified abnormal findings of blood chemistry: Secondary | ICD-10-CM | POA: Diagnosis not present

## 2024-05-06 LAB — TSH: TSH: 0.44 (ref 0.41–5.90)

## 2024-05-06 NOTE — Progress Notes (Signed)
 Endocrinology Consult Note                                            05/06/2024, 10:33 AM   Subjective:    Patient ID: Alexander Petty, male    DOB: 15-Jul-1951, PCP Marvine Rush, MD   Past Medical History:  Diagnosis Date   Hyperlipidemia    Hypertension    Past Surgical History:  Procedure Laterality Date   COLONOSCOPY WITH PROPOFOL  N/A 11/23/2023   Procedure: COLONOSCOPY WITH PROPOFOL ;  Surgeon: Shaaron Lamar HERO, MD;  [facility];  Service: Endoscopy;  Laterality: N/A;  2:00 PM,  ASA 2   INGUINAL HERNIA REPAIR     bilateral as a child   KNEE SURGERY Bilateral    ROTATOR CUFF REPAIR     UMBILICAL HERNIA REPAIR     Social History   Socioeconomic History   Marital status: Divorced    Spouse name: Not on file   Number of children: Not on file   Years of education: Not on file   Highest education level: Not on file  Occupational History   Not on file  Tobacco Use   Smoking status: Never   Smokeless tobacco: Never  Vaping Use   Vaping status: Former  Substance and Sexual Activity   Alcohol use: No   Drug use: No   Sexual activity: Not on file  Other Topics Concern   Not on file  Social History Narrative   Not on file   Social Drivers of Health   Financial Resource Strain: Not on file  Food Insecurity: Not on file  Transportation Needs: Not on file  Physical Activity: Not on file  Stress: Not on file  Social Connections: Not on file   Family History  Problem Relation Age of Onset   Colon polyps Neg Hx    Colon cancer Neg Hx    Outpatient Encounter Medications as of 05/06/2024  Medication Sig   Ascorbic Acid (VITAMIN C) 1000 MG tablet Take 1,000 mg by mouth daily.   aspirin EC 81 MG tablet Take 81 mg by mouth daily.   azelastine  (ASTELIN ) 0.1 % nasal spray Place 1 spray into both nostrils 2 (two) times daily. Use in each nostril as directed (Patient not taking: Reported on 05/06/2024)   HYDROcodone-acetaminophen (NORCO) 10-325 MG  tablet Take 1 tablet by mouth 4 (four) times daily.   ibuprofen (ADVIL,MOTRIN) 800 MG tablet Take 800 mg by mouth every 8 (eight) hours as needed for moderate pain.    lidocaine  (XYLOCAINE ) 2 % solution Use as directed 10 mLs in the mouth or throat every 3 (three) hours as needed.   mesalamine  (CANASA ) 1000 MG suppository Place 1 suppository (1,000 mg total) rectally at bedtime. (Patient not taking: Reported on 05/06/2024)   olmesartan (BENICAR) 40 MG tablet Take 40 mg by mouth daily.   polyethylene glycol powder (GLYCOLAX/MIRALAX) 17 GM/SCOOP powder Take 17 g by mouth once.   propranolol (INDERAL) 20 MG tablet Take 20 mg by mouth 2 (two) times daily.   rosuvastatin (CRESTOR) 10 MG tablet Take 10 mg by mouth at bedtime.   No facility-administered encounter medications on file as of 05/06/2024.   ALLERGIES: No Known Allergies  VACCINATION STATUS: Immunization History  Administered Date(s) Administered   PFIZER(Purple Top)SARS-COV-2 Vaccination 05/24/2020   Tdap 07/30/2023  HPI Alexander Petty is 73 y.o. male who presents today with a medical history as above. he is being seen in consultation for abnormal thyroid  function test requested by Marvine Rush, MD. History is obtained directly from the patient.  He denies any prior history of thyroid  dysfunction.  Due to unexplained weight loss over the last 3-22-month she was offered thyroid  function test profile in early July 2025 where he was found to have a TSH of 0.339.  According to the patient he lost 20 pounds mainly due to the fact that he could not eat adequately due to dental problems.  He denies palpitations, tremors, nor heat intolerance.  He denies any family history of thyroid  dysfunction. He denies dysphagia, shortness breath, nor voice change.  His other medical problems include hypertension and hyperlipidemia.  He is not taking any over-the-counter thyroid  supplements.  He is on rosuvastatin 10 mg p.o. daily, propranolol 20 mg  p.o. twice daily, Benicar 40 mg p.o. daily, hydrocodone, ibuprofen, aspirin. He denies any GI problems nor any liver or kidney problems. Review of Systems  Constitutional: + Lost 20 pounds in the last 6 months,  no fatigue, no subjective hyperthermia, no subjective hypothermia Eyes: no blurry vision, no xerophthalmia ENT: no sore throat, no nodules palpated in throat, no dysphagia/odynophagia, no hoarseness Cardiovascular: no Chest Pain, no Shortness of Breath, no palpitations, no leg swelling Respiratory: no cough, no shortness of breath Gastrointestinal: no Nausea/Vomiting/Diarhhea Musculoskeletal: no muscle/joint aches Skin: no rashes Neurological: no tremors, no numbness, no tingling, no dizziness Psychiatric: no depression, no anxiety  Objective:       05/06/2024   10:21 AM 01/29/2024   12:02 PM 01/04/2024    3:54 PM  Vitals with BMI  Height 5' 7    Weight 145 lbs    BMI 22.71    Systolic 128 166 826  Diastolic 84 84 93  Pulse 56 59 54    BP 128/84   Pulse (!) 56   Ht 5' 7 (1.702 m)   Wt 145 lb (65.8 kg)   BMI 22.71 kg/m   Wt Readings from Last 3 Encounters:  05/06/24 145 lb (65.8 kg)  01/04/24 154 lb (69.9 kg)  11/23/23 160 lb (72.6 kg)    Physical Exam  Constitutional:  Body mass index is 22.71 kg/m.,  not in acute distress, normal state of mind Eyes: PERRLA, EOMI, no exophthalmos ENT: moist mucous membranes, no gross thyromegaly, no gross cervical lymphadenopathy Cardiovascular: normal precordial activity, Regular Rate and Rhythm, no Murmur/Rubs/Gallops Respiratory:  adequate breathing efforts, no gross chest deformity, Clear to auscultation bilaterally Gastrointestinal: abdomen soft, Non -tender, No distension, Bowel Sounds present, no gross organomegaly Musculoskeletal: no gross deformities, strength intact in all four extremities, no peripheral edema Skin: moist, warm, no rashes Neurological: no tremor with outstretched hands, Deep tendon reflexes normal  in bilateral lower extremities.   March 18, 2024 TSH 0.339  Assessment & Plan:   -1.  Abnormal thyroid  function test  - Alexander Petty  is being seen at a kind request of Marvine Rush, MD. - I have reviewed his available thyroid  records and clinically evaluated the patient. - Based on these reviews, he has one-time measurement of suppressed TSH of 0.339,  however,  there is not sufficient information to proceed with definitive treatment plan. Patient will need full set thyroid  function test including TSH, free T4/ total T3 and antithyroid antibodies. If these labs are suggestive of primary hypothyroidism, he will be considered for thyroid  uptake  and scan. In the absence of thyroid  enlargement on clinical exam, he will not need thyroid  ultrasound at this time. If he returns with confirmed diagnosis of primary hypothyroidism, he will be considered for treatment options accordingly.   - I did not initiate any new prescriptions today. - he is advised to maintain close follow up with Marvine Rush, MD for primary care needs.   -Thank you for involving me in the care of this pleasant patient.  Time spent with the patient: 45  minutes spent in  counseling him about abnormal thyroid  function tests and the rest in obtaining information about his symptoms, reviewing his previous labs/studies (including abstractions from other facilities),  evaluations, and treatments,  and developing a plan to confirm diagnosis and long term treatment based on the latest standards of care/guidelines; and documenting his care.  Alexander Petty participated in the discussions, expressed understanding, and voiced agreement with the above plans.  All questions were answered to his satisfaction. he is encouraged to contact clinic should he have any questions or concerns prior to his return visit.  Follow up plan: No follow-ups on file.   Ranny Earl, MD [facility] [address] [place], [place] [address] Phone: [phone]  Fax: [phone]     05/06/2024, 10:33 AM  This note was partially dictated with voice recognition software. Similar sounding words can be transcribed inadequately or may not  be corrected upon review.

## 2024-05-08 LAB — TSH: TSH: 0.443 u[IU]/mL — ABNORMAL LOW (ref 0.450–4.500)

## 2024-05-08 LAB — THYROID PEROXIDASE ANTIBODY: Thyroperoxidase Ab SerPl-aCnc: 19 [IU]/mL (ref 0–34)

## 2024-05-08 LAB — T3: T3, Total: 146 ng/dL (ref 71–180)

## 2024-05-08 LAB — THYROGLOBULIN ANTIBODY: Thyroglobulin Antibody: <1 [IU]/mL (ref 0.0–0.9)

## 2024-05-08 LAB — T4, FREE: Free T4: 1.02 ng/dL (ref 0.82–1.77)

## 2024-05-11 ENCOUNTER — Telehealth: Payer: Self-pay | Admitting: "Endocrinology

## 2024-05-11 NOTE — Telephone Encounter (Signed)
 Called pt regarding his appt tomorrow. I do not see that he did labs or maybe they are not back? He did not answer NO VM

## 2024-05-12 ENCOUNTER — Encounter: Payer: Self-pay | Admitting: "Endocrinology

## 2024-05-12 ENCOUNTER — Ambulatory Visit: Admitting: "Endocrinology

## 2024-05-12 VITALS — BP 136/82 | HR 64 | Ht 67.0 in | Wt 144.2 lb

## 2024-05-12 DIAGNOSIS — R7989 Other specified abnormal findings of blood chemistry: Secondary | ICD-10-CM

## 2024-05-12 NOTE — Progress Notes (Signed)
 05/12/2024, 4:12 PM  Endocrinology follow-up note   Subjective:    Patient ID: Alexander Petty, male    DOB: 12/11/1950, PCP Marvine Rush, MD   Past Medical History:  Diagnosis Date   Hyperlipidemia    Hypertension    Past Surgical History:  Procedure Laterality Date   COLONOSCOPY WITH PROPOFOL  N/A 11/23/2023   Procedure: COLONOSCOPY WITH PROPOFOL ;  Surgeon: Shaaron Lamar HERO, MD;  [facility];  Service: Endoscopy;  Laterality: N/A;  2:00 PM,  ASA 2   INGUINAL HERNIA REPAIR     bilateral as a child   KNEE SURGERY Bilateral    ROTATOR CUFF REPAIR     UMBILICAL HERNIA REPAIR     Social History   Socioeconomic History   Marital status: Divorced    Spouse name: Not on file   Number of children: Not on file   Years of education: Not on file   Highest education level: Not on file  Occupational History   Not on file  Tobacco Use   Smoking status: Never   Smokeless tobacco: Never  Vaping Use   Vaping status: Former  Substance and Sexual Activity   Alcohol use: No   Drug use: No   Sexual activity: Not on file  Other Topics Concern   Not on file  Social History Narrative   Not on file   Social Drivers of Health   Financial Resource Strain: Not on file  Food Insecurity: Not on file  Transportation Needs: Not on file  Physical Activity: Not on file  Stress: Not on file  Social Connections: Not on file   Family History  Problem Relation Age of Onset   Colon polyps Neg Hx    Colon cancer Neg Hx    Outpatient Encounter Medications as of 05/12/2024  Medication Sig   Ascorbic Acid (VITAMIN C) 1000 MG tablet Take 1,000 mg by mouth daily.   aspirin EC 81 MG tablet Take 81 mg by mouth daily.   azelastine  (ASTELIN ) 0.1 % nasal spray Place 1 spray into both nostrils 2 (two) times daily. Use in each nostril as directed (Patient not taking: Reported on 05/06/2024)   HYDROcodone-acetaminophen (NORCO) 10-325 MG  tablet Take 1 tablet by mouth 4 (four) times daily.   ibuprofen (ADVIL,MOTRIN) 800 MG tablet Take 800 mg by mouth every 8 (eight) hours as needed for moderate pain.    lidocaine  (XYLOCAINE ) 2 % solution Use as directed 10 mLs in the mouth or throat every 3 (three) hours as needed.   mesalamine  (CANASA ) 1000 MG suppository Place 1 suppository (1,000 mg total) rectally at bedtime. (Patient not taking: Reported on 05/06/2024)   olmesartan (BENICAR) 40 MG tablet Take 40 mg by mouth daily.   polyethylene glycol powder (GLYCOLAX/MIRALAX) 17 GM/SCOOP powder Take 17 g by mouth once.   propranolol (INDERAL) 20 MG tablet Take 20 mg by mouth 2 (two) times daily.   rosuvastatin (CRESTOR) 10 MG tablet Take 10 mg by mouth at bedtime.   No facility-administered encounter medications on file as of 05/12/2024.   ALLERGIES: No Known Allergies  VACCINATION STATUS: Immunization History  Administered Date(s) Administered   PFIZER(Purple Top)SARS-COV-2 Vaccination 05/24/2020   Tdap 07/30/2023  Alexander Petty is 73 y.o. male who presents today with a medical history as above. he is being seen in follow-up after he was seen in in consultation for abnormal thyroid  function test requested by Marvine Rush, MD.  - See notes from his first visit.  He denies any prior history of thyroid  dysfunction.  Due to unexplained weight loss over the last 3-44-month she was offered thyroid  function test profile in early July 2025 where he was found to have a TSH of 0.339.  According to the patient he lost 20 pounds mainly due to the fact that he could not eat adequately due to dental problems. His weight is not stabilizing.  He is repeat thyroid  function test on unremarkable showing normal thyroid  hormones, negative antithyroid antibodies, improving TSH towards target. He denies palpitations, tremors, nor heat intolerance.  He denies any family history of thyroid  dysfunction. He denies dysphagia, shortness breath, nor voice  change.  His other medical problems include hypertension and hyperlipidemia.  He is not taking any over-the-counter thyroid  supplements.  He is on rosuvastatin 10 mg p.o. daily, propranolol 20 mg p.o. twice daily, Benicar 40 mg p.o. daily, hydrocodone, ibuprofen, aspirin. He denies any GI problems nor any liver or kidney problems. Review of Systems  Constitutional: + Steady weight,  no fatigue, no subjective hyperthermia, no subjective hypothermia   Objective:       05/12/2024    3:02 PM 05/06/2024   10:21 AM 01/29/2024   12:02 PM  Vitals with BMI  Height 5' 7 5' 7   Weight 144 lbs 3 oz 145 lbs   BMI 22.58 22.71   Systolic 136 128 833  Diastolic 82 84 84  Pulse 64 56 59    BP 136/82   Pulse 64   Ht 5' 7 (1.702 m)   Wt 144 lb 3.2 oz (65.4 kg)   BMI 22.58 kg/m   Wt Readings from Last 3 Encounters:  05/12/24 144 lb 3.2 oz (65.4 kg)  05/06/24 145 lb (65.8 kg)  01/04/24 154 lb (69.9 kg)    Physical Exam  Constitutional:  Body mass index is 22.58 kg/m.,  not in acute distress, normal state of mind Eyes: PERRLA, EOMI, no exophthalmos ENT: moist mucous membranes, no gross thyromegaly, no gross cervical lymphadenopathy   Recent Results (from the past 2160 hours)  TSH     Status: None   Collection Time: 05/06/24 12:00 AM  Result Value Ref Range   TSH 0.44 0.41 - 5.90    Comment: T4,FREE (DIRECT) 1.02, T3 146, THYROGLOBULIN ANTIBODY <1.0, THYROID  PEROXIDASE (TPO) Ab 19  TSH     Status: Abnormal   Collection Time: 05/06/24 11:05 AM  Result Value Ref Range   TSH 0.443 (L) 0.450 - 4.500 uIU/mL  T4, free     Status: None   Collection Time: 05/06/24 11:05 AM  Result Value Ref Range   Free T4 1.02 0.82 - 1.77 ng/dL  T3     Status: None   Collection Time: 05/06/24 11:05 AM  Result Value Ref Range   T3, Total 146 71 - 180 ng/dL  Thyroid  peroxidase antibody     Status: None   Collection Time: 05/06/24 11:05 AM  Result Value Ref Range   Thyroperoxidase Ab SerPl-aCnc 19 0 -  34 IU/mL  Thyroglobulin antibody     Status: None   Collection Time: 05/06/24 11:05 AM  Result Value Ref Range   Thyroglobulin Antibody <1.0 0.0 - 0.9 IU/mL  Comment: Thyroglobulin Antibody measured by [employer] Methodology It should be noted that the presence of thyroglobulin antibodies may not be pathogenic nor diagnostic, especially at very low levels. The assay manufacturer has found that four percent of individuals without evidence of thyroid  disease or autoimmunity will have positive TgAb levels up to 4 IU/mL.     March 18, 2024 TSH 0.339  Assessment & Plan:   -1.  Subclinical hypothyroidism   - I have reviewed his  new and available thyroid  records and clinically evaluated the patient. - Based on these reviews, he has improving subclinical hyperthyroidism, which will not need antithyroid intervention at this time.   He is antithyroid antibodies are negative indicating absence of background autoimmune condition. He will be put on expectant management with plan to repeat thyroid  function test in 6 months.  - he is advised to maintain close follow up with Marvine Rush, MD for primary care needs.   I spent  20  minutes in the care of the patient today including review of labs from Thyroid  Function, CMP, and other relevant labs ; imaging/biopsy records (current and previous including abstractions from other facilities); face-to-face time discussing  his lab results and symptoms, medications doses, his options of short and long term treatment based on the latest standards of care / guidelines;   and documenting the encounter.  Alexander Petty  participated in the discussions, expressed understanding, and voiced agreement with the above plans.  All questions were answered to his satisfaction. he is encouraged to contact clinic should he have any questions or concerns prior to his return visit.   Follow up plan: Return in about 6 months (around 11/12/2024) for F/U with  Pre-visit Labs.   Ranny Earl, MD [facility] [address] [place], [place] [address] Phone: [phone]  Fax: [phone]     05/12/2024, 4:12 PM  This note was partially dictated with voice recognition software. Similar sounding words can be transcribed inadequately or may not  be corrected upon review.

## 2024-05-23 DIAGNOSIS — G894 Chronic pain syndrome: Secondary | ICD-10-CM | POA: Diagnosis not present

## 2024-06-23 DIAGNOSIS — G894 Chronic pain syndrome: Secondary | ICD-10-CM | POA: Diagnosis not present

## 2024-07-12 ENCOUNTER — Ambulatory Visit: Payer: Self-pay | Admitting: Nurse Practitioner

## 2024-07-26 DIAGNOSIS — G894 Chronic pain syndrome: Secondary | ICD-10-CM | POA: Diagnosis not present

## 2024-07-26 DIAGNOSIS — Z23 Encounter for immunization: Secondary | ICD-10-CM | POA: Diagnosis not present

## 2024-08-25 DIAGNOSIS — G894 Chronic pain syndrome: Secondary | ICD-10-CM | POA: Diagnosis not present

## 2024-11-15 ENCOUNTER — Ambulatory Visit: Admitting: "Endocrinology
